# Patient Record
Sex: Female | Born: 1940
Health system: Southern US, Community
[De-identification: ages and names within clinical notes are randomized; demographics above are authoritative.]

## PROBLEM LIST (undated history)

## (undated) DIAGNOSIS — I2699 Other pulmonary embolism without acute cor pulmonale: Secondary | ICD-10-CM

## (undated) DIAGNOSIS — B269 Mumps without complication: Secondary | ICD-10-CM

## (undated) DIAGNOSIS — L237 Allergic contact dermatitis due to plants, except food: Secondary | ICD-10-CM

## (undated) DIAGNOSIS — R29898 Other symptoms and signs involving the musculoskeletal system: Secondary | ICD-10-CM

## (undated) DIAGNOSIS — I10 Essential (primary) hypertension: Secondary | ICD-10-CM

## (undated) DIAGNOSIS — Z8619 Personal history of other infectious and parasitic diseases: Secondary | ICD-10-CM

## (undated) DIAGNOSIS — K219 Gastro-esophageal reflux disease without esophagitis: Secondary | ICD-10-CM

## (undated) DIAGNOSIS — N3281 Overactive bladder: Secondary | ICD-10-CM

## (undated) DIAGNOSIS — D649 Anemia, unspecified: Secondary | ICD-10-CM

## (undated) DIAGNOSIS — R609 Edema, unspecified: Secondary | ICD-10-CM

## (undated) DIAGNOSIS — M419 Scoliosis, unspecified: Secondary | ICD-10-CM

## (undated) HISTORY — DX: Personal history of other infectious and parasitic diseases: Z86.19

## (undated) HISTORY — DX: Other pulmonary embolism without acute cor pulmonale: I26.99

## (undated) HISTORY — DX: Overactive bladder: N32.81

## (undated) HISTORY — DX: Mumps without complication: B26.9

## (undated) HISTORY — DX: Other symptoms and signs involving the musculoskeletal system: R29.898

## (undated) HISTORY — PX: KNEE SURGERY: SHX244

## (undated) HISTORY — PX: FOOT SURGERY: SHX648

## (undated) HISTORY — DX: Edema, unspecified: R60.9

---

## 1997-12-29 ENCOUNTER — Other Ambulatory Visit: Admission: RE | Admit: 1997-12-29 | Discharge: 1997-12-29 | Payer: Self-pay | Admitting: Obstetrics & Gynecology

## 1998-10-05 ENCOUNTER — Other Ambulatory Visit: Admission: RE | Admit: 1998-10-05 | Discharge: 1998-10-05 | Payer: Self-pay | Admitting: Obstetrics & Gynecology

## 1999-05-02 ENCOUNTER — Other Ambulatory Visit: Admission: RE | Admit: 1999-05-02 | Discharge: 1999-05-02 | Payer: Self-pay | Admitting: Obstetrics & Gynecology

## 2000-05-04 ENCOUNTER — Other Ambulatory Visit: Admission: RE | Admit: 2000-05-04 | Discharge: 2000-05-04 | Payer: Self-pay | Admitting: Obstetrics & Gynecology

## 2001-08-31 ENCOUNTER — Other Ambulatory Visit: Admission: RE | Admit: 2001-08-31 | Discharge: 2001-08-31 | Payer: Self-pay | Admitting: Obstetrics & Gynecology

## 2002-09-14 ENCOUNTER — Other Ambulatory Visit: Admission: RE | Admit: 2002-09-14 | Discharge: 2002-09-14 | Payer: Self-pay | Admitting: Obstetrics & Gynecology

## 2003-10-04 ENCOUNTER — Other Ambulatory Visit: Admission: RE | Admit: 2003-10-04 | Discharge: 2003-10-04 | Payer: Self-pay | Admitting: Obstetrics & Gynecology

## 2003-11-23 ENCOUNTER — Ambulatory Visit (HOSPITAL_COMMUNITY): Admission: RE | Admit: 2003-11-23 | Discharge: 2003-11-23 | Payer: Self-pay | Admitting: Gastroenterology

## 2005-03-06 ENCOUNTER — Other Ambulatory Visit: Admission: RE | Admit: 2005-03-06 | Discharge: 2005-03-06 | Payer: Self-pay | Admitting: Obstetrics & Gynecology

## 2007-01-14 ENCOUNTER — Ambulatory Visit (HOSPITAL_COMMUNITY): Admission: RE | Admit: 2007-01-14 | Discharge: 2007-01-14 | Payer: Self-pay | Admitting: Chiropractic Medicine

## 2008-02-09 ENCOUNTER — Encounter: Admission: RE | Admit: 2008-02-09 | Discharge: 2008-03-01 | Payer: Self-pay | Admitting: Internal Medicine

## 2010-02-28 ENCOUNTER — Ambulatory Visit (HOSPITAL_COMMUNITY): Admission: RE | Admit: 2010-02-28 | Discharge: 2010-02-28 | Payer: Self-pay | Admitting: Gastroenterology

## 2010-12-20 NOTE — Op Note (Signed)
NAME:  Dominique Cruz, Dominique Cruz NO.:  [id number]   MEDICAL RECORD NO.:  [id number]                   PATIENT TYPE:  AMB   LOCATION:  ENDO                                 FACILITY:  [facility]   PHYSICIAN:  Danise Edge, M.D.                DATE OF BIRTH:  1941/03/20   DATE OF PROCEDURE:  11/23/2003  DATE OF DISCHARGE:                                 OPERATIVE REPORT   PROCEDURE:  Screening colonoscopy.   REFERRING PHYSICIAN:  Georgann Housekeeper, M.D. with [facility].   INDICATIONS FOR PROCEDURE:  Dominique Cruz is a 70 year old female born  07-09-1941.  Dominique Cruz is scheduled to undergo her first screening  colonoscopy with polypectomy to prevent colon cancer.   ENDOSCOPIST:  Danise Edge, M.D.   PREMEDICATION:  Versed 6 mg, Demerol 60 mg.   PROCEDURE IN DETAIL:  After obtaining informed consent, Dominique Cruz was  placed in the left lateral decubitus position.  I administered intravenous  Demerol and intravenous Versed to achieve conscious sedation for the  procedure.  Patient's blood pressure, oxygen saturation, and cardiac rhythm  were monitored throughout the procedure and documented in the medical  record.   Anal inspection and digital rectal exam was normal.  The Olympus adjustable  pediatric colonoscope was then introduced into the rectum and advanced to  the cecum.  Colonic preparation for the exam today was excellent.   Rectum was normal.  Sigmoid colon and descending colon normal.  Splenic  flexure normal.  Transverse colon normal.  Hepatic flexure normal.  Ascending colon normal.  Cecum and ileocecal valve normal.   ASSESSMENT:  Normal screening proctocolonoscopy of the cecum.  No endoscopic  evidence for the presence of colorectal neoplasia.                                               Danise Edge, M.D.    MJ/MEDQ  D:  11/23/2003  T:  11/23/2003  Job:  [id number]   cc:   Georgann Housekeeper, M.D.  [address]  [address]  [place]  [place] [address]  Fax: [phone]

## 2011-05-16 ENCOUNTER — Emergency Department (INDEPENDENT_AMBULATORY_CARE_PROVIDER_SITE_OTHER): Payer: 59

## 2011-05-16 ENCOUNTER — Encounter: Payer: Self-pay | Admitting: *Deleted

## 2011-05-16 ENCOUNTER — Other Ambulatory Visit: Payer: Self-pay

## 2011-05-16 ENCOUNTER — Emergency Department (HOSPITAL_BASED_OUTPATIENT_CLINIC_OR_DEPARTMENT_OTHER)
Admission: EM | Admit: 2011-05-16 | Discharge: 2011-05-17 | Disposition: A | Discharge status: Home / Self Care | Payer: 59 | Attending: Emergency Medicine | Admitting: Emergency Medicine

## 2011-05-16 DIAGNOSIS — R0789 Other chest pain: Secondary | ICD-10-CM

## 2011-05-16 DIAGNOSIS — R079 Chest pain, unspecified: Secondary | ICD-10-CM | POA: Insufficient documentation

## 2011-05-16 DIAGNOSIS — M810 Age-related osteoporosis without current pathological fracture: Secondary | ICD-10-CM | POA: Insufficient documentation

## 2011-05-16 DIAGNOSIS — R791 Abnormal coagulation profile: Secondary | ICD-10-CM

## 2011-05-16 DIAGNOSIS — K219 Gastro-esophageal reflux disease without esophagitis: Secondary | ICD-10-CM | POA: Insufficient documentation

## 2011-05-16 DIAGNOSIS — Z79899 Other long term (current) drug therapy: Secondary | ICD-10-CM | POA: Insufficient documentation

## 2011-05-16 DIAGNOSIS — I2699 Other pulmonary embolism without acute cor pulmonale: Secondary | ICD-10-CM

## 2011-05-16 HISTORY — DX: Gastro-esophageal reflux disease without esophagitis: K21.9

## 2011-05-16 LAB — DIFFERENTIAL
Basophils Absolute: 0 10*3/uL (ref 0.0–0.1)
Basophils Relative: 0 % (ref 0–1)
Eosinophils Absolute: 0.2 10*3/uL (ref 0.0–0.7)
Eosinophils Relative: 2 % (ref 0–5)
Lymphocytes Relative: 26 % (ref 12–46)
Lymphs Abs: 2.4 10*3/uL (ref 0.7–4.0)
Monocytes Absolute: 0.9 10*3/uL (ref 0.1–1.0)
Monocytes Relative: 9 % (ref 3–12)
Neutro Abs: 5.7 10*3/uL (ref 1.7–7.7)
Neutrophils Relative %: 62 % (ref 43–77)

## 2011-05-16 LAB — BASIC METABOLIC PANEL
BUN: 14 mg/dL (ref 6–23)
CO2: 25 mEq/L (ref 19–32)
Calcium: 9.2 mg/dL (ref 8.4–10.5)
Chloride: 99 mEq/L (ref 96–112)
Creatinine, Ser: 0.6 mg/dL (ref 0.50–1.10)
GFR calc Af Amer: >90 mL/min (ref >90)
GFR calc non Af Amer: >90 mL/min (ref >90)
Glucose, Bld: 95 mg/dL (ref 70–99)
Potassium: 3.8 mEq/L (ref 3.5–5.1)
Sodium: 135 mEq/L (ref 135–145)

## 2011-05-16 LAB — CBC
HCT: 35.9 % — ABNORMAL LOW (ref 36.0–46.0)
Hemoglobin: 11.8 g/dL — ABNORMAL LOW (ref 12.0–15.0)
MCH: 26.8 pg (ref 26.0–34.0)
MCHC: 32.9 g/dL (ref 30.0–36.0)
MCV: 81.6 fL (ref 78.0–100.0)
Platelets: 271 10*3/uL (ref 150–400)
RBC: 4.4 MIL/uL (ref 3.87–5.11)
RDW: 17.3 % — ABNORMAL HIGH (ref 11.5–15.5)
WBC: 9.2 10*3/uL (ref 4.0–10.5)

## 2011-05-16 MED ORDER — IOHEXOL 350 MG/ML SOLN
80.0000 mL | Freq: Once | INTRAVENOUS | Status: AC | PRN
  Administered 2011-05-16: 80 mL via INTRAVENOUS

## 2011-05-16 NOTE — ED Notes (Signed)
Pt presents to ED today with leftsided chest pain that started earlier today and radiates around to back.  Pt was seen at PMD and had chest xray and was told to come to ER for additional testing and follow-up.

## 2011-05-16 NOTE — ED Notes (Signed)
Pt c/o left lower chest pain that began yesterday. Pt was seen by her PMD who did a chest xray and blood work. the patient sts her PMD saw a shadow on her lung and the d-dimer is 0.66. Pt's PMD sent her here for a chest ct. Pt flew to/from [place] 2 weeks ago.

## 2011-05-16 NOTE — ED Notes (Signed)
MD at bedside. 

## 2011-05-16 NOTE — ED Provider Notes (Signed)
History     CSN: [id number] Arrival date & time: 05/16/2011  9:49 PM  Chief Complaint  Patient presents with  . Chest Pain    (Consider location/radiation/quality/duration/timing/severity/associated sxs/prior treatment) HPI This is a 70 year old white female who recently traveled to [place] returning September 30. About 24 hours ago she developed a sharp pleuritic pain in her chest. This is located under the left breast and radiating around to the back. It was moderate in severity yesterday and is almost resolved at this time. It was worse with deep breathing or movement. There is no associated dyspnea. She was seen by her physician today who did a d-dimer and found to be elevated at 0.66. She is here for further evaluation. She denies lower extremity pain or swelling.  Past Medical History  Diagnosis Date  . GERD (gastroesophageal reflux disease)   . Osteoporosis     Past Surgical History  Procedure Date  . Foot surgery     No family history on file.  History  Substance Use Topics  . Smoking status: Never Smoker   . Smokeless tobacco: Not on file  . Alcohol Use: Yes    OB History    Grav Para Term Preterm Abortions TAB SAB Ect Mult Living                  Review of Systems  All other systems reviewed and are negative.    Allergies  Contact lens care products and Sulfa drugs cross reactors  Home Medications   Current Outpatient Rx  Name Route Sig Dispense Refill  . CALCIUM CARBONATE-VITAMIN D 600-400 MG-UNIT PO TABS Oral Take 1 tablet by mouth daily.      Marland Kitchen VITAMIN D 1000 UNITS PO TABS Oral Take 1,000 Units by mouth daily.      Marland Kitchen ESOMEPRAZOLE MAGNESIUM 40 MG PO CPDR Oral Take 40 mg by mouth daily before breakfast.      . OMEGA-3 FATTY ACIDS 1000 MG PO CAPS Oral Take 1 g by mouth daily.      . IBUPROFEN 200 MG PO TABS Oral Take 400 mg by mouth every 6 (six) hours as needed. For pain     . MAGNESIUM OXIDE 400 MG PO TABS Oral Take 400 mg by mouth daily.      Marland Kitchen  ONE-DAILY MULTI VITAMINS PO TABS Oral Take 1 tablet by mouth daily.      . OXYBUTYNIN CHLORIDE ER 5 MG PO TB24 Oral Take 5 mg by mouth daily.      Marland Kitchen POTASSIUM 99 MG PO TABS Oral Take 1 tablet by mouth daily.      Marland Kitchen PYRIDOXINE HCL 100 MG PO TABS Oral Take 100 mg by mouth daily.      Marland Kitchen RALOXIFENE HCL 60 MG PO TABS Oral Take 60 mg by mouth daily.      Marland Kitchen VITAMIN B-12 1000 MCG PO TABS Oral Take 1,000 mcg by mouth daily.      Marland Kitchen VITAMIN E 400 UNITS PO CAPS Oral Take 400 Units by mouth daily.        BP 176/76  Pulse 75  Temp(Src) 97.9 F (36.6 C) (Oral)  Resp 18  Wt 193 lb (87.544 kg)  SpO2 98%  Physical Exam General: Well-developed, well-nourished female in no acute distress; appearance consistent with age of record HENT: normocephalic, atraumatic Eyes: pupils equal round and reactive to light; extraocular muscles intact Neck: supple Heart: regular rate and rhythm Lungs: clear to auscultation bilaterally Chest: non-tender Abdomen: soft;  nontender; nondistended; no masses or hepatosplenomegaly; bowel sounds present Extremities: No deformity; full range of motion; pulses normal; no edema Neurologic: Awake, alert and oriented;motor function intact in all extremities and symmetric; no facial droop Skin: Warm and dry Psychiatric: Normal mood and affect    ED Course  Procedures (including critical care time)    MDM   EKG Interpretation:  Date & Time: 05/16/2011 10:20 PM  Rate: 63  Rhythm: normal sinus rhythm  QRS Axis: normal  Intervals: normal  ST/T Wave abnormalities: normal  Conduction Disutrbances:none  Narrative Interpretation:   Old EKG Reviewed: none available  Nursing notes and vitals signs, including pulse oximetry, reviewed.  Summary of this visit's results, reviewed by myself:  Labs:  Results for orders placed during the hospital encounter of 05/16/11  BASIC METABOLIC PANEL      Component Value Range   Sodium 135  135 - 145 (mEq/L)   Potassium 3.8  3.5 - 5.1  (mEq/L)   Chloride 99  96 - 112 (mEq/L)   CO2 25  19 - 32 (mEq/L)   Glucose, Bld 95  70 - 99 (mg/dL)   BUN 14  6 - 23 (mg/dL)   Creatinine, Ser 1.61  0.50 - 1.10 (mg/dL)   Calcium 9.2  8.4 - 09.6 (mg/dL)   GFR calc non Af Amer >90  >90 (mL/min)   GFR calc Af Amer >90  >90 (mL/min)  CBC      Component Value Range   WBC 9.2  4.0 - 10.5 (K/uL)   RBC 4.40  3.87 - 5.11 (MIL/uL)   Hemoglobin 11.8 (*) 12.0 - 15.0 (g/dL)   HCT 04.5 (*) 40.9 - 46.0 (%)   MCV 81.6  78.0 - 100.0 (fL)   MCH 26.8  26.0 - 34.0 (pg)   MCHC 32.9  30.0 - 36.0 (g/dL)   RDW 81.1 (*) 91.4 - 15.5 (%)   Platelets 271  150 - 400 (K/uL)  DIFFERENTIAL      Component Value Range   Neutrophils Relative 62  43 - 77 (%)   Neutro Abs 5.7  1.7 - 7.7 (K/uL)   Lymphocytes Relative 26  12 - 46 (%)   Lymphs Abs 2.4  0.7 - 4.0 (K/uL)   Monocytes Relative 9  3 - 12 (%)   Monocytes Absolute 0.9  0.1 - 1.0 (K/uL)   Eosinophils Relative 2  0 - 5 (%)   Eosinophils Absolute 0.2  0.0 - 0.7 (K/uL)   Basophils Relative 0  0 - 1 (%)   Basophils Absolute 0.0  0.0 - 0.1 (K/uL)    Imaging Studies: Ct Angio Chest W/cm &/or Wo Cm  05/16/2011  *RADIOLOGY REPORT*  Clinical Data:  Left lower chest pain for 1 day, elevated D-dimer, recent air travel to and from [place]  CT ANGIOGRAPHY CHEST WITH CONTRAST  Technique:  Multidetector CT imaging of the chest was performed using the standard protocol during bolus administration of intravenous contrast.  Multiplanar CT image reconstructions including MIPs were obtained to evaluate the vascular anatomy.  Contrast: 80mL OMNIPAQUE IOHEXOL 350 MG/ML IV SOLN  Comparison:  None  Findings: Aorta normal caliber with minimal scattered atherosclerotic calcification. Visualized portion of upper abdomen unremarkable. No thoracic adenopathy. Single small filling defect identified within left lower lobe pulmonary artery compatible pulmonary embolism. Associated atelectasis in the left lower lobe with minimal associated  pleural effusion. Mild subsegmental atelectasis right middle and right lower lobes. Remaining pulmonary arteries patent. No pulmonary infiltrate, mass or nodule.  Bones unremarkable.  Review of the MIP images confirms the above findings.  IMPRESSION: Single small pulmonary embolus in a left lower lobe pulmonary arterial branch. Associated atelectasis and minimal effusion and left lung base. Mild right basilar atelectasis as well.  Critical Value/emergent results were called by telephone at the time of interpretation on 05/16/2011  at 2350 hrs  to  Dr. Read Drivers, who verbally acknowledged these results.  Original Report Authenticated By: Lollie Marrow, M.D.    12:16 AM The patient's diagnosis and care plan was discussed with Dr. Clent Ridges of the [facility] medicine. We will start the patient on twice daily Lovenox shots at home. She will be educated by nursing staff who will provide her with the Lovenox patient education package. Dr. Clent Ridges we'll arrange for the patient does be seen by Dr. Donette Larry on Monday.          Hanley Seamen, MD 05/17/11 [id number]

## 2011-05-17 MED ORDER — ENOXAPARIN SODIUM 60 MG/0.6ML ~~LOC~~ SOLN
1.0000 mg/kg | Freq: Once | SUBCUTANEOUS | Status: DC
  Filled 2011-05-17: qty 0.3

## 2011-05-17 MED ORDER — ENOXAPARIN SODIUM 150 MG/ML ~~LOC~~ SOLN
SUBCUTANEOUS | Status: AC
  Administered 2011-05-17: 90 mg
  Filled 2011-05-17: qty 1

## 2011-05-17 MED ORDER — ENOXAPARIN SODIUM 100 MG/ML ~~LOC~~ SOLN: 90.0000 mg | Freq: Two times a day (BID) | SUBCUTANEOUS | Status: DC

## 2011-05-17 NOTE — ED Notes (Signed)
Education and take home kit provided to pt and family.  Pt stated understanding and able to verbalize procedure

## 2011-05-19 ENCOUNTER — Other Ambulatory Visit: Payer: Self-pay | Admitting: Internal Medicine

## 2011-05-20 ENCOUNTER — Ambulatory Visit
Admission: RE | Admit: 2011-05-20 | Discharge: 2011-05-20 | Disposition: A | Discharge status: Home / Self Care | Payer: 59 | Source: Ambulatory Visit | Attending: Internal Medicine | Admitting: Internal Medicine

## 2012-12-06 ENCOUNTER — Ambulatory Visit: Payer: Self-pay | Admitting: Podiatry

## 2013-05-11 ENCOUNTER — Encounter: Payer: Self-pay | Admitting: Podiatry

## 2013-05-11 ENCOUNTER — Ambulatory Visit (INDEPENDENT_AMBULATORY_CARE_PROVIDER_SITE_OTHER): Payer: 59 | Admitting: Podiatry

## 2013-05-11 VITALS — BP 142/80 | HR 64 | Resp 16 | Ht 63.0 in | Wt 164.0 lb

## 2013-05-11 DIAGNOSIS — M779 Enthesopathy, unspecified: Secondary | ICD-10-CM

## 2013-05-11 DIAGNOSIS — G5761 Lesion of plantar nerve, right lower limb: Secondary | ICD-10-CM

## 2013-05-11 DIAGNOSIS — G5760 Lesion of plantar nerve, unspecified lower limb: Secondary | ICD-10-CM

## 2013-05-11 NOTE — Progress Notes (Signed)
Subjective:     Patient ID: Dominique Cruz, female   DOB: [birth year], 72 y.o.   MRN: [id number]  HPI patient states my right foot is feeling much better but I need new pads.   Review of Systems  All other systems reviewed and are negative.       Objective:   Physical Exam  Vitals reviewed. Cardiovascular: Intact distal pulses.    neurological intact. Patient's right forefoot is doing much better with a negative Mulder's sign noted. Odder discomfort around the metatarsal heads where there are prominent.    Assessment:     Neuroma symptoms doing well. Mild capsulitis noted due to foot structure.    Plan:     H&P performed. No injection today but metatarsal pads were dispensed to reduce pressure against metatarsal heads. Reappoint as needed

## 2013-06-06 ENCOUNTER — Ambulatory Visit (INDEPENDENT_AMBULATORY_CARE_PROVIDER_SITE_OTHER): Payer: 59 | Admitting: Podiatry

## 2013-06-06 ENCOUNTER — Encounter: Payer: Self-pay | Admitting: Podiatry

## 2013-06-06 VITALS — BP 140/74 | HR 64 | Resp 16

## 2013-06-06 DIAGNOSIS — M779 Enthesopathy, unspecified: Secondary | ICD-10-CM

## 2013-06-06 MED ORDER — TRIAMCINOLONE ACETONIDE 10 MG/ML IJ SUSP
5.0000 mg | Freq: Once | INTRAMUSCULAR | Status: AC
  Administered 2013-06-06: 5 mg via INTRA_ARTICULAR

## 2013-06-07 NOTE — Progress Notes (Signed)
Subjective:     Patient ID: Dominique Cruz, female   DOB: 1941-05-26, 72 y.o.   MRN: [id number]  Toe Pain    patient points to the right big toe stating it has been getting sore. Does not remember specific injury   Review of Systems     Objective:   Physical Exam  Vitals reviewed. Constitutional: She is oriented to person, place, and time.  Cardiovascular: Intact distal pulses.   Musculoskeletal: Normal range of motion.  Neurological: She is oriented to person, place, and time.  Skin: Skin is warm.   patient has pain in the interphalangeal joint of the right hallux with mild swelling noted    Assessment:     Tendinitis right interphalangeal joint hallux    Plan:     Prep done and the interphalangeal injection administered 3 mg Kenalog 5 mg Xylocaine. Reappoint if symptoms occur

## 2013-07-20 ENCOUNTER — Ambulatory Visit: Payer: 59 | Admitting: Podiatry

## 2013-07-27 ENCOUNTER — Encounter: Payer: Self-pay | Admitting: Podiatry

## 2013-07-27 ENCOUNTER — Ambulatory Visit (INDEPENDENT_AMBULATORY_CARE_PROVIDER_SITE_OTHER): Payer: 59 | Admitting: Podiatry

## 2013-07-27 VITALS — BP 152/80 | HR 74 | Resp 16 | Ht 64.0 in | Wt 169.0 lb

## 2013-07-27 DIAGNOSIS — M7750 Other enthesopathy of unspecified foot: Secondary | ICD-10-CM

## 2013-07-27 DIAGNOSIS — M2040 Other hammer toe(s) (acquired), unspecified foot: Secondary | ICD-10-CM

## 2013-07-27 NOTE — Progress Notes (Signed)
Subjective:     Patient ID: Dominique Cruz, female   DOB: 11-03-1940, 72 y.o.   MRN: [id number]  HPI patient presents stating I need new orthotics I have ingrown toenails of both my big toes medial borders and I have pain at the end of my second toe left foot   Review of Systems     Objective:   Physical Exam Neurovascular status is intact with no health history changes noted and I noted there to be incurvated medial border hallux both feet and significant structural changes with tendinitis of both feet. 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 Discussed ingrown toenail correction and she will have that done in January on both feet. Reappoint when orthotics are ready

## 2013-08-01 ENCOUNTER — Ambulatory Visit (INDEPENDENT_AMBULATORY_CARE_PROVIDER_SITE_OTHER): Payer: 59 | Admitting: Podiatry

## 2013-08-01 ENCOUNTER — Encounter: Payer: Self-pay | Admitting: Podiatry

## 2013-08-01 VITALS — BP 156/79 | HR 60 | Resp 12

## 2013-08-01 DIAGNOSIS — L600 Ingrowing nail: Secondary | ICD-10-CM

## 2013-08-01 NOTE — Patient Instructions (Signed)

## 2013-08-01 NOTE — Progress Notes (Signed)
   Subjective:    Patient ID: Dominique Cruz, female    DOB: [birth year], 72 y.o.   MRN: [id number]  HPI Comments: Both big toenails still sore.''  Toe Pain       Review of Systems     Objective:   Physical Exam        Assessment & Plan:

## 2013-08-01 NOTE — Progress Notes (Signed)
Subjective:     Patient ID: Dominique Cruz, female   DOB: 1940-12-20, 72 y.o.   MRN: [id number]  HPI patient presents to have ingrown toenails of both big toes fixed medial border. 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 Reviewed risks patient wants procedure and today I infiltrated each hallux 60 mg Xylocaine Marcaine mixture remove the medial borders exposed matrix and applied phenol 3 applications followed by alcohol lavaged and sterile dressing. Instructed on soaks at this time

## 2014-02-16 ENCOUNTER — Other Ambulatory Visit: Payer: Self-pay | Admitting: Internal Medicine

## 2014-02-16 DIAGNOSIS — Z1331 Encounter for screening for depression: Secondary | ICD-10-CM | POA: Diagnosis not present

## 2014-02-16 DIAGNOSIS — Z1231 Encounter for screening mammogram for malignant neoplasm of breast: Secondary | ICD-10-CM

## 2014-02-16 DIAGNOSIS — Z23 Encounter for immunization: Secondary | ICD-10-CM | POA: Diagnosis not present

## 2014-02-16 DIAGNOSIS — R030 Elevated blood-pressure reading, without diagnosis of hypertension: Secondary | ICD-10-CM | POA: Diagnosis not present

## 2014-02-16 DIAGNOSIS — K219 Gastro-esophageal reflux disease without esophagitis: Secondary | ICD-10-CM | POA: Diagnosis not present

## 2014-02-16 DIAGNOSIS — R7309 Other abnormal glucose: Secondary | ICD-10-CM | POA: Diagnosis not present

## 2014-02-16 DIAGNOSIS — N3942 Incontinence without sensory awareness: Secondary | ICD-10-CM | POA: Diagnosis not present

## 2014-02-16 DIAGNOSIS — Z0000 Encounter for general adult medical examination without abnormal findings: Secondary | ICD-10-CM | POA: Diagnosis not present

## 2014-02-16 DIAGNOSIS — M899 Disorder of bone, unspecified: Secondary | ICD-10-CM | POA: Diagnosis not present

## 2014-02-16 DIAGNOSIS — Z136 Encounter for screening for cardiovascular disorders: Secondary | ICD-10-CM | POA: Diagnosis not present

## 2014-02-16 DIAGNOSIS — M949 Disorder of cartilage, unspecified: Secondary | ICD-10-CM | POA: Diagnosis not present

## 2014-03-16 ENCOUNTER — Ambulatory Visit: Payer: 59

## 2014-03-20 ENCOUNTER — Other Ambulatory Visit: Payer: Self-pay | Admitting: Obstetrics & Gynecology

## 2014-03-20 DIAGNOSIS — Z124 Encounter for screening for malignant neoplasm of cervix: Secondary | ICD-10-CM | POA: Diagnosis not present

## 2014-03-20 DIAGNOSIS — Z1231 Encounter for screening mammogram for malignant neoplasm of breast: Secondary | ICD-10-CM | POA: Diagnosis not present

## 2014-03-21 LAB — CYTOLOGY - PAP

## 2014-05-18 DIAGNOSIS — Z23 Encounter for immunization: Secondary | ICD-10-CM | POA: Diagnosis not present

## 2014-06-22 DIAGNOSIS — Z961 Presence of intraocular lens: Secondary | ICD-10-CM | POA: Diagnosis not present

## 2014-10-05 DIAGNOSIS — M7701 Medial epicondylitis, right elbow: Secondary | ICD-10-CM | POA: Diagnosis not present

## 2014-10-05 DIAGNOSIS — M545 Low back pain: Secondary | ICD-10-CM | POA: Diagnosis not present

## 2014-10-05 DIAGNOSIS — M9905 Segmental and somatic dysfunction of pelvic region: Secondary | ICD-10-CM | POA: Diagnosis not present

## 2014-10-05 DIAGNOSIS — M7702 Medial epicondylitis, left elbow: Secondary | ICD-10-CM | POA: Diagnosis not present

## 2014-10-09 DIAGNOSIS — M7701 Medial epicondylitis, right elbow: Secondary | ICD-10-CM | POA: Diagnosis not present

## 2014-10-09 DIAGNOSIS — M7702 Medial epicondylitis, left elbow: Secondary | ICD-10-CM | POA: Diagnosis not present

## 2014-10-09 DIAGNOSIS — M9905 Segmental and somatic dysfunction of pelvic region: Secondary | ICD-10-CM | POA: Diagnosis not present

## 2014-10-09 DIAGNOSIS — M545 Low back pain: Secondary | ICD-10-CM | POA: Diagnosis not present

## 2014-10-11 DIAGNOSIS — M7701 Medial epicondylitis, right elbow: Secondary | ICD-10-CM | POA: Diagnosis not present

## 2014-10-11 DIAGNOSIS — M9905 Segmental and somatic dysfunction of pelvic region: Secondary | ICD-10-CM | POA: Diagnosis not present

## 2014-10-11 DIAGNOSIS — M545 Low back pain: Secondary | ICD-10-CM | POA: Diagnosis not present

## 2014-10-11 DIAGNOSIS — M7702 Medial epicondylitis, left elbow: Secondary | ICD-10-CM | POA: Diagnosis not present

## 2014-10-16 DIAGNOSIS — M7702 Medial epicondylitis, left elbow: Secondary | ICD-10-CM | POA: Diagnosis not present

## 2014-10-16 DIAGNOSIS — M9905 Segmental and somatic dysfunction of pelvic region: Secondary | ICD-10-CM | POA: Diagnosis not present

## 2014-10-16 DIAGNOSIS — M7701 Medial epicondylitis, right elbow: Secondary | ICD-10-CM | POA: Diagnosis not present

## 2014-10-16 DIAGNOSIS — M545 Low back pain: Secondary | ICD-10-CM | POA: Diagnosis not present

## 2014-10-18 DIAGNOSIS — M9905 Segmental and somatic dysfunction of pelvic region: Secondary | ICD-10-CM | POA: Diagnosis not present

## 2014-10-18 DIAGNOSIS — M545 Low back pain: Secondary | ICD-10-CM | POA: Diagnosis not present

## 2014-10-18 DIAGNOSIS — M7702 Medial epicondylitis, left elbow: Secondary | ICD-10-CM | POA: Diagnosis not present

## 2014-10-18 DIAGNOSIS — M7701 Medial epicondylitis, right elbow: Secondary | ICD-10-CM | POA: Diagnosis not present

## 2014-10-23 DIAGNOSIS — M7702 Medial epicondylitis, left elbow: Secondary | ICD-10-CM | POA: Diagnosis not present

## 2014-10-23 DIAGNOSIS — M7701 Medial epicondylitis, right elbow: Secondary | ICD-10-CM | POA: Diagnosis not present

## 2014-10-23 DIAGNOSIS — M9905 Segmental and somatic dysfunction of pelvic region: Secondary | ICD-10-CM | POA: Diagnosis not present

## 2014-10-23 DIAGNOSIS — M545 Low back pain: Secondary | ICD-10-CM | POA: Diagnosis not present

## 2014-10-25 DIAGNOSIS — M545 Low back pain: Secondary | ICD-10-CM | POA: Diagnosis not present

## 2014-10-25 DIAGNOSIS — M7702 Medial epicondylitis, left elbow: Secondary | ICD-10-CM | POA: Diagnosis not present

## 2014-10-25 DIAGNOSIS — M9905 Segmental and somatic dysfunction of pelvic region: Secondary | ICD-10-CM | POA: Diagnosis not present

## 2014-10-25 DIAGNOSIS — M7701 Medial epicondylitis, right elbow: Secondary | ICD-10-CM | POA: Diagnosis not present

## 2014-12-20 ENCOUNTER — Ambulatory Visit (INDEPENDENT_AMBULATORY_CARE_PROVIDER_SITE_OTHER): Payer: Medicare Other | Admitting: Family Medicine

## 2014-12-20 ENCOUNTER — Encounter: Payer: Self-pay | Admitting: Family Medicine

## 2014-12-20 ENCOUNTER — Encounter (INDEPENDENT_AMBULATORY_CARE_PROVIDER_SITE_OTHER): Payer: Self-pay

## 2014-12-20 VITALS — BP 165/83 | HR 60 | Ht 63.0 in | Wt 165.0 lb

## 2014-12-20 DIAGNOSIS — M25552 Pain in left hip: Secondary | ICD-10-CM

## 2014-12-20 NOTE — Patient Instructions (Signed)
You have piriformis syndrome. Do home exercises (side hip raises, standing hip rotations) 3 sets of 10 once a day. Add ankle weight if these become too easy. Hold stretches for 20-30 seconds, repeat 3 times (pick 2 or 3 on the handout to do every day). Heat 15 minutes at a time 3-4 times a day. Tennis ball massage may help. Call me if you want to do physical therapy. Follow up with me in 6 weeks.

## 2014-12-22 DIAGNOSIS — M25552 Pain in left hip: Secondary | ICD-10-CM | POA: Insufficient documentation

## 2014-12-22 NOTE — Assessment & Plan Note (Signed)
negative logroll, no anterior pain and excellent motion - doubt intraarticular pathology.  Consistent with piriformis syndrome.  Shown home exercises to do daily.  Heat for spasms, tennis ball massage.  Consider PT if not improving.  F/u in 6 weeks.

## 2014-12-22 NOTE — Progress Notes (Signed)
PCP: Georgann HousekeeperHUSAIN,KARRAR, MD  Subjective:   HPI: Patient is a 74 y.o. female here for left hip pain.  Patient reports for about 4 months she's had posterior left hip pain. No known injury or trauma. Leg feels weak and has been since she was young. Tried tylenol when this throbs. Hasn't tried any exercises, stretches.  Past Medical History  Diagnosis Date  . GERD (gastroesophageal reflux disease)   . Osteoporosis   . Swelling     FEET AND LEGS B/L    Current Outpatient Prescriptions on File Prior to Visit  Medication Sig Dispense Refill  . aspirin 81 MG tablet Take 81 mg by mouth daily.    . Calcium Carbonate-Vitamin D (CALTRATE 600+D) 600-400 MG-UNIT per tablet Take 1 tablet by mouth daily.      . cholecalciferol (VITAMIN D) 1000 UNITS tablet Take 1,000 Units by mouth daily.      . fish oil-omega-3 fatty acids 1000 MG capsule Take 1 g by mouth daily.      . fluticasone (FLONASE) 50 MCG/ACT nasal spray     . ibuprofen (ADVIL,MOTRIN) 200 MG tablet Take 400 mg by mouth every 6 (six) hours as needed. For pain     . magnesium oxide (MAG-OX) 400 MG tablet Take 400 mg by mouth daily.      . Multiple Vitamin (MULTIVITAMIN) tablet Take 1 tablet by mouth daily.      Marland Kitchen. oxybutynin (DITROPAN-XL) 5 MG 24 hr tablet Take 5 mg by mouth daily.      . pantoprazole (PROTONIX) 40 MG tablet     . Potassium 99 MG TABS Take 1 tablet by mouth daily.      Marland Kitchen. pyridoxine (B-6) 100 MG tablet Take 100 mg by mouth daily.      Marland Kitchen. triamcinolone ointment (KENALOG) 0.1 %     . vitamin B-12 (CYANOCOBALAMIN) 1000 MCG tablet Take 1,000 mcg by mouth daily.      . vitamin E 400 UNIT capsule Take 400 Units by mouth daily.       No current facility-administered medications on file prior to visit.    Past Surgical History  Procedure Laterality Date  . Foot surgery Bilateral     BUNION  . Foot surgery Right     TENDON  . Knee surgery Right     Allergies  Allergen Reactions  . Ao-Disc Catalyst     Eye swelled shut   . Sulfa Drugs Cross Reactors     unknown    History   Social History  . Marital Status: Married    Spouse Name: N/A  . Number of Children: N/A  . Years of Education: N/A   Occupational History  . Not on file.   Social History Main Topics  . Smoking status: Former Smoker    Types: Cigarettes    Quit date: 05/11/1961  . Smokeless tobacco: Never Used  . Alcohol Use: 0.0 oz/week    0 Standard drinks or equivalent per week     Comment: WINE 3 TIMES WEEKLY  . Drug Use: No  . Sexual Activity: Not on file   Other Topics Concern  . Not on file   Social History Narrative    No family history on file.  BP 165/83 mmHg  Pulse 60  Ht 5\' 3"  (1.6 m)  Wt 165 lb (74.844 kg)  BMI 29.24 kg/m2  Review of Systems: See HPI above.    Objective:  Physical Exam:  Gen: NAD  Left hip: No gross  deformity, swelling, bruising. TTP over piriformis.  No trochanter, back, other tenderness.  No midline or bony TTP. FROM hip without pain passively. Strength LEs 5/5 all muscle groups except hip abduction 4-/5. 2+ MSRs in patellar and achilles tendons, equal bilaterally. Negative SLRs. Sensation intact to light touch bilaterally. Negative logroll bilateral hips Negative fabers.  Feels stretch in area of pain on left with piriformis.    Assessment & Plan:  1. Left hip pain - negative logroll, no anterior pain and excellent motion - doubt intraarticular pathology.  Consistent with piriformis syndrome.  Shown home exercises to do daily.  Heat for spasms, tennis ball massage.  Consider PT if not improving.  F/u in 6 weeks.

## 2015-02-07 ENCOUNTER — Ambulatory Visit (INDEPENDENT_AMBULATORY_CARE_PROVIDER_SITE_OTHER): Payer: Medicare Other | Admitting: Family Medicine

## 2015-02-07 ENCOUNTER — Encounter: Payer: Self-pay | Admitting: Family Medicine

## 2015-02-07 ENCOUNTER — Encounter (INDEPENDENT_AMBULATORY_CARE_PROVIDER_SITE_OTHER): Payer: Self-pay

## 2015-02-07 VITALS — BP 154/60 | HR 66 | Ht 63.0 in | Wt 168.0 lb

## 2015-02-07 DIAGNOSIS — M25552 Pain in left hip: Secondary | ICD-10-CM | POA: Diagnosis not present

## 2015-02-07 DIAGNOSIS — R29898 Other symptoms and signs involving the musculoskeletal system: Secondary | ICD-10-CM

## 2015-02-08 NOTE — Assessment & Plan Note (Signed)
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  She does have some weakness bilaterally on testing - would benefit from physical therapy and a dedicated home exercise program - will go ahead with this.  F/u in 6 weeks.

## 2015-02-08 NOTE — Progress Notes (Signed)
PCP: Georgann Housekeeper, MD  Subjective:   HPI: Patient is a 74 y.o. female here for left hip pain.  5/18: Patient reports for about 4 months she's had posterior left hip pain. No known injury or trauma. Leg feels weak and has been since she was young. Tried tylenol when this throbs. Hasn't tried any exercises, stretches.  7/6: Patient reports she's about 60% improved from last visit. Left leg still feels weak especially with stairs. Doing home exercises. No new injuries or complaints.  Past Medical History  Diagnosis Date  . GERD (gastroesophageal reflux disease)   . Osteoporosis   . Swelling     FEET AND LEGS B/L    Current Outpatient Prescriptions on File Prior to Visit  Medication Sig Dispense Refill  . aspirin 81 MG tablet Take 81 mg by mouth daily.    . Calcium Carbonate-Vitamin D (CALTRATE 600+D) 600-400 MG-UNIT per tablet Take 1 tablet by mouth daily.      . cholecalciferol (VITAMIN D) 1000 UNITS tablet Take 1,000 Units by mouth daily.      . fish oil-omega-3 fatty acids 1000 MG capsule Take 1 g by mouth daily.      . fluticasone (FLONASE) 50 MCG/ACT nasal spray     . ibuprofen (ADVIL,MOTRIN) 200 MG tablet Take 400 mg by mouth every 6 (six) hours as needed. For pain     . magnesium oxide (MAG-OX) 400 MG tablet Take 400 mg by mouth daily.      . Multiple Vitamin (MULTIVITAMIN) tablet Take 1 tablet by mouth daily.      Marland Kitchen oxybutynin (DITROPAN-XL) 5 MG 24 hr tablet Take 5 mg by mouth daily.      . pantoprazole (PROTONIX) 40 MG tablet     . Potassium 99 MG TABS Take 1 tablet by mouth daily.      Marland Kitchen pyridoxine (B-6) 100 MG tablet Take 100 mg by mouth daily.      Marland Kitchen triamcinolone ointment (KENALOG) 0.1 %     . vitamin B-12 (CYANOCOBALAMIN) 1000 MCG tablet Take 1,000 mcg by mouth daily.      . vitamin E 400 UNIT capsule Take 400 Units by mouth daily.       No current facility-administered medications on file prior to visit.    Past Surgical History  Procedure Laterality  Date  . Foot surgery Bilateral     BUNION  . Foot surgery Right     TENDON  . Knee surgery Right     Allergies  Allergen Reactions  . Ao-Disc Catalyst     Eye swelled shut  . Sulfa Drugs Cross Reactors     unknown    History   Social History  . Marital Status: Married    Spouse Name: N/A  . Number of Children: N/A  . Years of Education: N/A   Occupational History  . Not on file.   Social History Main Topics  . Smoking status: Former Smoker    Types: Cigarettes    Quit date: 05/11/1961  . Smokeless tobacco: Never Used  . Alcohol Use: 0.0 oz/week    0 Standard drinks or equivalent per week     Comment: WINE 3 TIMES WEEKLY  . Drug Use: No  . Sexual Activity: Not on file   Other Topics Concern  . Not on file   Social History Narrative    No family history on file.  BP 154/60 mmHg  Pulse 66  Ht  (1.6 m)  Wt 168 lb (  76.204 kg)  BMI 29.77 kg/m2  Review of Systems: See HPI above.    Objective:  Physical Exam:  Gen: NAD  Left hip: No gross deformity, swelling, bruising. TTP over piriformis.  No trochanter, back, other tenderness.  No midline or bony TTP. FROM hip without pain passively. Strength bilateral lower extremities 4/5 with hip flexion, abduction, straight leg raise, adduction.  Otherwise 5/5 bilateral lower extremities. Negative SLRs. Sensation intact to light touch bilaterally. Negative logroll bilateral hips Negative fabers.  Feels stretch in area of pain on left with piriformis.    Assessment & Plan:  1. 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  She does have some weakness bilaterally on testing - would benefit from physical therapy and a dedicated home exercise program - will go ahead with this.  F/u in 6 weeks.

## 2015-02-15 ENCOUNTER — Ambulatory Visit: Payer: Medicare Other | Attending: Family Medicine | Admitting: Physical Therapy

## 2015-02-15 DIAGNOSIS — M25552 Pain in left hip: Secondary | ICD-10-CM | POA: Insufficient documentation

## 2015-02-15 DIAGNOSIS — M6289 Other specified disorders of muscle: Secondary | ICD-10-CM | POA: Insufficient documentation

## 2015-02-15 DIAGNOSIS — R269 Unspecified abnormalities of gait and mobility: Secondary | ICD-10-CM | POA: Insufficient documentation

## 2015-02-15 DIAGNOSIS — M545 Low back pain: Secondary | ICD-10-CM | POA: Insufficient documentation

## 2015-02-21 ENCOUNTER — Ambulatory Visit: Payer: Medicare Other | Admitting: Physical Therapy

## 2015-02-21 DIAGNOSIS — M6289 Other specified disorders of muscle: Secondary | ICD-10-CM | POA: Diagnosis not present

## 2015-02-21 DIAGNOSIS — M25552 Pain in left hip: Secondary | ICD-10-CM | POA: Diagnosis not present

## 2015-02-21 DIAGNOSIS — M545 Low back pain, unspecified: Secondary | ICD-10-CM

## 2015-02-21 DIAGNOSIS — R269 Unspecified abnormalities of gait and mobility: Secondary | ICD-10-CM

## 2015-02-21 DIAGNOSIS — R29898 Other symptoms and signs involving the musculoskeletal system: Secondary | ICD-10-CM

## 2015-02-22 NOTE — Therapy (Signed)
[facility] Outpatient Rehabilitation [facility] [address]  [address] [place], [place], [address] Phone: [phone]   Fax:  [phone]  Physical Therapy Evaluation  Patient Details  Name: Dominique Cruz MRN: [id number] Date of Birth: 1940-11-18 Referring Provider:  Lenda Kelp, MD  Encounter Date: 02/21/2015      PT End of Session - 02/21/15 1328    Visit Number 1   Number of Visits 16   Date for PT Re-Evaluation 04/18/15   PT Start Time 1004   PT Stop Time 1056   PT Time Calculation (min) 52 min   Activity Tolerance Patient tolerated treatment well   Behavior During Therapy [facility] for tasks assessed/performed      Past Medical History  Diagnosis Date  . GERD (gastroesophageal reflux disease)   . Osteoporosis   . Swelling     FEET AND LEGS B/L    Past Surgical History  Procedure Laterality Date  . Foot surgery Bilateral     BUNION  . Foot surgery Right     TENDON  . Knee surgery Right     There were no vitals filed for this visit.  Visit Diagnosis:  Weakness of both hips  Left-sided low back pain without sciatica  Left hip pain  Abnormality of gait      Subjective Assessment - 02/21/15 1010    Subjective Patient presents with complaints of low and bilateral hip pain. Patient gesturing to SI joint/deep buttock as primary location of pain, but reports pain sometimes "wraps down the side of her hips". Patient acknowledges longstanding weakness in legs (left > right since childhood) and wants to work on strengthening legs.   Pertinent History h/o lumbar stenosis   Limitations Sitting;Other (comment)  sleeping   How long can you sit comfortably? 1 hour   How long can you walk comfortably? 1 mile   Patient Stated Goals Walk up/down stairs step over step   Currently in Pain? No/denies   Pain Score --  Least 0/10, Avg 3/10, Worst 5/10   Pain Location Back  SI joint   Pain Orientation Left   Pain Descriptors / Indicators Dull;Aching    Pain Radiating Towards from low back/SI joints to lateral hips L>R   Pain Onset Other (comment)  several years   Pain Frequency Intermittent   Aggravating Factors  working in the garden   Pain Relieving Factors standing, tylenol   Effect of Pain on Daily Activities Limits sitting tolerance, diffcultly finding comfortable sleeping postion, unable to climb stairs reciprocally            [facility] PT Assessment - 02/21/15 1009    Assessment   Medical Diagnosis Left hip pain/weakness   Onset Date/Surgical Date 08/23/14   Next MD Visit 5 weeks   Prior Therapy HEP from MD   Balance Screen   Has the patient fallen in the past 6 months No   Has the patient had a decrease in activity level because of a fear of falling?  No   Is the patient reluctant to leave their home because of a fear of falling?  No   Prior Function   Level of Independence Independent   Vocation Retired   Leisure walking, reading, Media planner   Overall Cognitive Status Within Functional Limits for tasks assessed   Observation/Other Assessments   Focus on Therapeutic Outcomes (FOTO)  38% (62% limited); predicted 50% (50% limited)   ROM / Strength   AROM / PROM /  Strength AROM;Strength   AROM   Overall AROM  Within functional limits for tasks performed   Strength   Strength Assessment Site Hip;Knee   Right/Left Hip Right;Left   Right Hip Flexion 3+/5   Right Hip Extension 3/5   Right Hip ABduction 4-/5   Right Hip ADduction 3+/5   Left Hip Flexion 3+/5   Left Hip Extension 3/5   Left Hip ABduction 3+/5   Left Hip ADduction 3+/5   Flexibility   Soft Tissue Assessment /Muscle Length yes   ITB tight Left   Piriformis tight L>R   Special Tests    Special Tests Hip Special Tests   Hip Special Tests  Luisa Hart (FABER) Test;Trendelenberg Test;Thomas Test;Ober's Test;Piriformis Test   Luisa Hart [facility]) Test   Findings Positive   Side Left;Right  L > R   Trendelenburg Test   Findings Positive   Side Left    Thomas Test    Findings Positive   Side Left;Right   Ober's Test   Findings Positive   Side Left   Piriformis Test   Findings Positive   Side  Left   Ambulation/Gait   Ambulation/Gait Assistance 7: Independent   Gait Pattern Wide base of support  LE ER bilaterally   Stairs Yes   Stair Management Technique Step to pattern;One rail Right        Today's Treatment  Review of MD prescribed exercises -  Piriformis stretches (hooklying FABER position knee to chest and modified piriformis stretch in supine with crossbody pull) ITB stretch in standing (wallflower stretch) - patient reports discomfort at wrist when using arm fully extended to prop against wall, therefore modified stretch using forearm prop to alleviate strain on wrist  HEP instruction - Hamstring stretch with towel 20"x3 SKTC 20"x3 Bridging x10 Standing SLR, hip abduction, hip extension with UE support on chair x10 each            PT Education - 02/21/15 1331    Education provided Yes   Education Details Review of MD issued exercises, Initial PT HEP, Education in use of pillow between knee when sleeping in sidelying to maintain more neutral hip position   Person(s) Educated Patient   Methods Explanation;Demonstration;Handout   Comprehension Verbalized understanding;Returned demonstration;Need further instruction          PT Short Term Goals - 02/22/15 0913    PT SHORT TERM GOAL #1   Title Independent with initial HEP (03/21/15)   Time 4   Period Weeks   Status New           PT Long Term Goals - 02/22/15 0913    PT LONG TERM GOAL #1   Title Independent with advanced HEP (04/18/15)   Time 8   Period Weeks   Status New   PT LONG TERM GOAL #2   Title Proximal bilateral LE strength 4/5 or greater for improved balance and stability during gait (04/18/15)   Time 8   Period Weeks   Status New   PT LONG TERM GOAL #3   Title Patient will climb stairs reciprocally with or without rail support  (04/18/15)   Time 8   Period Weeks   Status New   PT LONG TERM GOAL #4   Title Patient without sleep disturbance related to low back or hip pain (04/18/15)   Time 8   Period Weeks   Status New               Plan - 02/22/15 9604  Clinical Impression Statement Patient presents to OPPT with left low back/hip pain with occasional radiation dowm lateral hip. Flexibility mildly limited with significant weakness noted in core and proximal LE musculature. Pain and weakness resulting in sleep disruption and difficulty with reciprocal stair negotiation.   Pt will benefit from skilled therapeutic intervention in order to improve on the following deficits Pain;Impaired flexibility;Decreased range of motion;Decreased strength;Decreased mobility;Difficulty walking;Abnormal gait;Improper body mechanics;Decreased activity tolerance   Rehab Potential Good   PT Frequency 2x / week   PT Duration 8 weeks   PT Treatment/Interventions Therapeutic exercise;Therapeutic activities;Manual techniques;Functional mobility training;Gait training;Stair training;Balance training;Moist Heat;Ultrasound;Cryotherapy;Electrical Stimulation;Iontophoresis 4mg /ml Dexamethasone;ADLs/Self Care Home Management;Patient/family education   PT Next Visit Plan Review of HEP, low back/hip strengthening   Consulted and Agree with Plan of Care Patient          G-Codes - 03-01-2015 1610    Functional Assessment Tool Used FOTO 38% (62% limited)   Functional Limitation Mobility: Walking and moving around   Mobility: Walking and Moving Around Current Status [phone]) At least 60 percent but less than 80 percent impaired, limited or restricted   Mobility: Walking and Moving Around Goal Status [phone]) At least 40 percent but less than 60 percent impaired, limited or restricted  predicted 50%       Problem List Patient Active Problem List   Diagnosis Date Noted  . Left hip pain 12/22/2014    [name], PT, MPT 03-01-15,  9:39 AM  [facility] [address]  [address] [place], [place], [address] Phone: [phone]   Fax:  [phone]

## 2015-02-23 ENCOUNTER — Ambulatory Visit: Payer: Medicare Other | Admitting: Rehabilitation

## 2015-02-23 DIAGNOSIS — M545 Low back pain, unspecified: Secondary | ICD-10-CM

## 2015-02-23 DIAGNOSIS — R269 Unspecified abnormalities of gait and mobility: Secondary | ICD-10-CM | POA: Diagnosis not present

## 2015-02-23 DIAGNOSIS — R29898 Other symptoms and signs involving the musculoskeletal system: Secondary | ICD-10-CM

## 2015-02-23 DIAGNOSIS — M6289 Other specified disorders of muscle: Secondary | ICD-10-CM | POA: Diagnosis not present

## 2015-02-23 DIAGNOSIS — M25552 Pain in left hip: Secondary | ICD-10-CM | POA: Diagnosis not present

## 2015-02-23 NOTE — Therapy (Signed)
[facility] Outpatient Rehabilitation [facility] [address]  [address] [place], [place], [address] Phone: [phone]   Fax:  [phone]  Physical Therapy Treatment  Patient Details  Name: Dominique Cruz MRN: [id number] Date of Birth: [birth year] Referring Provider:  Lenda Kelp, MD  Encounter Date: 02/23/2015      PT End of Session - 02/23/15 0848    Visit Number 2   Number of Visits 16   Date for PT Re-Evaluation 04/18/15   PT Start Time 0846   PT Stop Time 0925   PT Time Calculation (min) 39 min      Past Medical History  Diagnosis Date  . GERD (gastroesophageal reflux disease)   . Osteoporosis   . Swelling     FEET AND LEGS B/L    Past Surgical History  Procedure Laterality Date  . Foot surgery Bilateral     BUNION  . Foot surgery Right     TENDON  . Knee surgery Right     There were no vitals filed for this visit.  Visit Diagnosis:  Weakness of both hips  Left-sided low back pain without sciatica  Left hip pain  Abnormality of gait      Subjective Assessment - 02/23/15 0848    Subjective Denies pain currently but states it can reach a 1-2/10 when it hits. Reports compliance with HEP and notes some pain but limits the motion.    Currently in Pain? No/denies     Today's Treatment: TherEx- Nustep level 4x5' (UE/LE) Stretch Bil SKTC, Piriformis, ITB 3x20" (manually) Bridge x10 Hooklying Hip Add with ball 5"x10 Hooklying Hip Abd with Green TB DL O96, SL E95  Hooklying March x10 SLR x10 each Hooklying Hip Ext Iso into plinth 3"x10 Bridges x10      PT Short Term Goals - 02/23/15 0850    PT SHORT TERM GOAL #1   Title Independent with initial HEP (03/21/15)   Status On-going           PT Long Term Goals - 02/23/15 0850    PT LONG TERM GOAL #1   Title Independent with advanced HEP (04/18/15)   Status On-going   PT LONG TERM GOAL #2   Title Proximal bilateral LE strength 4/5 or greater for improved balance and  stability during gait (04/18/15)   Status On-going   PT LONG TERM GOAL #3   Title Patient will climb stairs reciprocally with or without rail support (04/18/15)   Status On-going   PT LONG TERM GOAL #4   Title Patient without sleep disturbance related to low back or hip pain (04/18/15)   Status On-going               Plan - 02/23/15 2841    Clinical Impression Statement Good tolerance to exercises with minimal pain noted. Focused mainly on hip strengthening today and will add in core strengthening.    PT Next Visit Plan low back/hip strengthening   Consulted and Agree with Plan of Care Patient        Problem List Patient Active Problem List   Diagnosis Date Noted  . Left hip pain 12/22/2014    Ronney Lion, [place] 02/23/2015, 9:25 AM  [facility] [address]  [address] [place], [place], [address] Phone: [phone]   Fax:  [phone]

## 2015-02-27 ENCOUNTER — Ambulatory Visit: Payer: Medicare Other | Admitting: Rehabilitation

## 2015-02-27 DIAGNOSIS — R29898 Other symptoms and signs involving the musculoskeletal system: Secondary | ICD-10-CM

## 2015-02-27 DIAGNOSIS — M545 Low back pain, unspecified: Secondary | ICD-10-CM

## 2015-02-27 DIAGNOSIS — M25552 Pain in left hip: Secondary | ICD-10-CM

## 2015-02-27 DIAGNOSIS — M6289 Other specified disorders of muscle: Secondary | ICD-10-CM | POA: Diagnosis not present

## 2015-02-27 DIAGNOSIS — R269 Unspecified abnormalities of gait and mobility: Secondary | ICD-10-CM

## 2015-02-27 NOTE — Therapy (Signed)
[facility] Outpatient Rehabilitation [facility] [address]  [address] [place], [place], [address] Phone: [phone]   Fax:  [phone]  Physical Therapy Treatment  Patient Details  Name: Dominique Cruz MRN: [id number] Date of Birth: [birth year] Referring Provider:  Lenda Kelp, MD  Encounter Date: 02/27/2015      PT End of Session - 02/27/15 0844    Visit Number 3   Number of Visits 16   Date for PT Re-Evaluation 04/18/15   PT Start Time 0845   PT Stop Time 0923   PT Time Calculation (min) 38 min      Past Medical History  Diagnosis Date  . GERD (gastroesophageal reflux disease)   . Osteoporosis   . Swelling     FEET AND LEGS B/L    Past Surgical History  Procedure Laterality Date  . Foot surgery Bilateral     BUNION  . Foot surgery Right     TENDON  . Knee surgery Right     There were no vitals filed for this visit.  Visit Diagnosis:  Weakness of both hips  Left-sided low back pain without sciatica  Left hip pain  Abnormality of gait      Subjective Assessment - 02/27/15 0846    Subjective Felt ok after last time, "I could feel that I had done exercise."   Currently in Pain? No/denies      Today's Treatment:  TherEx- Nustep level 4x5' (UE/LE) Stretch Bil SKTC, Piriformis, ITB 3x20" (manually) Bridge with ball squeeze 5"x10 Hooklying Hip Abd with Green TB DL O96 Side-Lying Clam ER with Green TB x10 bilateral (noted pain on Lt but pt able to complete) Hooklying Hip Ext Iso into plinth 3"x12 Slow March on Blue Foam x10 Heel Raises on Blue Foam x10  Manual- Rt Side-Lying STM and Strumming to Lt ITB        PT Short Term Goals - 02/27/15 0850    PT SHORT TERM GOAL #1   Title Independent with initial HEP (03/21/15)   Status Achieved           PT Long Term Goals - 02/23/15 0850    PT LONG TERM GOAL #1   Title Independent with advanced HEP (04/18/15)   Status On-going   PT LONG TERM GOAL #2   Title Proximal  bilateral LE strength 4/5 or greater for improved balance and stability during gait (04/18/15)   Status On-going   PT LONG TERM GOAL #3   Title Patient will climb stairs reciprocally with or without rail support (04/18/15)   Status On-going   PT LONG TERM GOAL #4   Title Patient without sleep disturbance related to low back or hip pain (04/18/15)   Status On-going               Plan - 02/27/15 2952    Clinical Impression Statement Extremely tight and tender ITB noted with pt only able to handle light pressure. Good tolerance to exercises with pain noted only during Lt Clam ER and marching on blue foam.    PT Next Visit Plan low back/hip strengthening, ITB STM   Consulted and Agree with Plan of Care Patient        Problem List Patient Active Problem List   Diagnosis Date Noted  . Left hip pain 12/22/2014    Ronney Lion, PTA 02/27/2015, 9:24 AM  [facility] Outpatient Rehabilitation [facility] [address]  [address] [place], [place],  [place] Phone: [phone]   Fax:  [phone]

## 2015-03-01 ENCOUNTER — Ambulatory Visit: Payer: Medicare Other | Admitting: Physical Therapy

## 2015-03-01 DIAGNOSIS — R269 Unspecified abnormalities of gait and mobility: Secondary | ICD-10-CM | POA: Diagnosis not present

## 2015-03-01 DIAGNOSIS — M6289 Other specified disorders of muscle: Secondary | ICD-10-CM | POA: Diagnosis not present

## 2015-03-01 DIAGNOSIS — R29898 Other symptoms and signs involving the musculoskeletal system: Secondary | ICD-10-CM

## 2015-03-01 DIAGNOSIS — M545 Low back pain, unspecified: Secondary | ICD-10-CM

## 2015-03-01 DIAGNOSIS — M25552 Pain in left hip: Secondary | ICD-10-CM

## 2015-03-01 NOTE — Therapy (Signed)
[facility] Outpatient Rehabilitation [facility] [address]  [address] [place], [place], [address] Phone: [phone]   Fax:  [phone]  Physical Therapy Treatment  Patient Details  Name: Onelia Cadmus MRN: [id number] Date of Birth: 1941/06/05 Referring Provider:  Lenda Kelp, MD  Encounter Date: 03/01/2015      PT End of Session - 03/01/15 0852    Visit Number 4   Number of Visits 16   Date for PT Re-Evaluation 04/18/15   PT Start Time 0846   PT Stop Time 0928   PT Time Calculation (min) 42 min   Activity Tolerance Patient tolerated treatment well   Behavior During Therapy [facility] for tasks assessed/performed      Past Medical History  Diagnosis Date  . GERD (gastroesophageal reflux disease)   . Osteoporosis   . Swelling     FEET AND LEGS B/L    Past Surgical History  Procedure Laterality Date  . Foot surgery Bilateral     BUNION  . Foot surgery Right     TENDON  . Knee surgery Right     There were no vitals filed for this visit.  Visit Diagnosis:  Weakness of both hips  Left-sided low back pain without sciatica  Left hip pain  Abnormality of gait      Subjective Assessment - 03/01/15 0850    Currently in Pain? Yes   Pain Score 2    Pain Location Back   Pain Orientation Left;Lower           Today's Treatment:  TherEx- Nustep level 4x6' (UE/LE) Stretch Bil SKTC, Piriformis, ITB 3x20" (manually) Bridge with ball squeeze 5"x10 Hooklying Hip Abd with Green TB DL O96 Side-Lying Clam ER with Green TB x10 bilateral (noted pain on Lt and tenderness with TB placement on lateral thighs but pt able to complete) Standing bilateral ITB stretch 3x20" Slow March on Blue Foam x10 - 2 poles Heel Raises on Blue Foam x10 - 2 poles Standing Hip Abduction on Blue Foam x10 - 2 poles Standing Hip Extension on Blue Foam x10 - 2 poles  Manual- Bil Side-Lying STM and Strumming to bilateral ITB            PT Short Term Goals -  03/01/15 1107    PT SHORT TERM GOAL #1   Title Independent with initial HEP (03/21/15)   Status Achieved           PT Long Term Goals - 03/01/15 1106    PT LONG TERM GOAL #1   Title Independent with advanced HEP (04/18/15)   Status On-going   PT LONG TERM GOAL #2   Title Proximal bilateral LE strength 4/5 or greater for improved balance and stability during gait (04/18/15)   Status On-going   PT LONG TERM GOAL #3   Title Patient will climb stairs reciprocally with or without rail support (04/18/15)   Status On-going   PT LONG TERM GOAL #4   Title Patient without sleep disturbance related to low back or hip pain (04/18/15)   Status On-going               Plan - 03/01/15 1044    Clinical Impression Statement Continued tightness/tenderness in bilateral ITB limiting therapy tolerance, with patient tolerating stretching but limited STM and pain limiting resisted hip abduction/ER.   PT Next Visit Plan low back/hip strengthening, ITB STM/stretching   Consulted and Agree with Plan of Care Patient  Problem List Patient Active Problem List   Diagnosis Date Noted  . Left hip pain 12/22/2014    [name], PT, MPT 03/01/2015, 11:09 AM  [facility] [address]  [address] [place], [place], [address] Phone: [phone]   Fax:  [phone]

## 2015-03-02 DIAGNOSIS — R32 Unspecified urinary incontinence: Secondary | ICD-10-CM | POA: Diagnosis not present

## 2015-03-02 DIAGNOSIS — Z0000 Encounter for general adult medical examination without abnormal findings: Secondary | ICD-10-CM | POA: Diagnosis not present

## 2015-03-02 DIAGNOSIS — Z1389 Encounter for screening for other disorder: Secondary | ICD-10-CM | POA: Diagnosis not present

## 2015-03-02 DIAGNOSIS — K219 Gastro-esophageal reflux disease without esophagitis: Secondary | ICD-10-CM | POA: Diagnosis not present

## 2015-03-02 DIAGNOSIS — M1990 Unspecified osteoarthritis, unspecified site: Secondary | ICD-10-CM | POA: Diagnosis not present

## 2015-03-02 DIAGNOSIS — M4806 Spinal stenosis, lumbar region: Secondary | ICD-10-CM | POA: Diagnosis not present

## 2015-03-02 DIAGNOSIS — R7309 Other abnormal glucose: Secondary | ICD-10-CM | POA: Diagnosis not present

## 2015-03-02 DIAGNOSIS — M8588 Other specified disorders of bone density and structure, other site: Secondary | ICD-10-CM | POA: Diagnosis not present

## 2015-03-06 ENCOUNTER — Ambulatory Visit: Payer: Medicare Other | Attending: Family Medicine | Admitting: Rehabilitation

## 2015-03-06 DIAGNOSIS — M6289 Other specified disorders of muscle: Secondary | ICD-10-CM | POA: Diagnosis not present

## 2015-03-06 DIAGNOSIS — M545 Low back pain, unspecified: Secondary | ICD-10-CM

## 2015-03-06 DIAGNOSIS — M25552 Pain in left hip: Secondary | ICD-10-CM | POA: Diagnosis not present

## 2015-03-06 DIAGNOSIS — R269 Unspecified abnormalities of gait and mobility: Secondary | ICD-10-CM | POA: Insufficient documentation

## 2015-03-06 DIAGNOSIS — R29898 Other symptoms and signs involving the musculoskeletal system: Secondary | ICD-10-CM

## 2015-03-06 NOTE — Therapy (Signed)
[facility] Outpatient Rehabilitation [facility] [address]  [address] [place], [place], [address] Phone: [phone]   Fax:  [phone]  Physical Therapy Treatment  Patient Details  Name: Dominique Cruz MRN: [id number] Date of Birth: Oct 22, 1940 Referring Provider:  Lenda Kelp, MD  Encounter Date: 03/06/2015      PT End of Session - 03/06/15 0848    Visit Number 5   Number of Visits 16   Date for PT Re-Evaluation 04/18/15   PT Start Time 0845   PT Stop Time 0925   PT Time Calculation (min) 40 min   Activity Tolerance Patient tolerated treatment well   Behavior During Therapy [facility] for tasks assessed/performed      Past Medical History  Diagnosis Date  . GERD (gastroesophageal reflux disease)   . Osteoporosis   . Swelling     FEET AND LEGS B/L    Past Surgical History  Procedure Laterality Date  . Foot surgery Bilateral     BUNION  . Foot surgery Right     TENDON  . Knee surgery Right     There were no vitals filed for this visit.  Visit Diagnosis:  Weakness of both hips  Left-sided low back pain without sciatica  Left hip pain  Abnormality of gait      Subjective Assessment - 03/06/15 0846    Subjective Reports being really sore today. Was hurting really bad yesterday, could have been from cleaning out their rental home then going to [employer].    Currently in Pain? Yes   Pain Score 4    Pain Location Back   Pain Orientation Left;Lower      TherEx- Nustep level 4x6' (UE/LE) Stretch Bil SKTC, Piriformis, ITB 3x20" (manually) Bridge with ball squeeze 5"x12 Side-Lying Clam ER with Green TB x10 bilateral Standing bilateral ITB stretch 3x20" Slow March on Blue Foam x12 - HHA on counter Standing Hip Abduction on Blue Foam x10 - HHA on counter Standing Hip Extension on Blue Foam x10 - HHA on counter LAQ with Ball Squeeze 10x3"  Manual- Bil Side-Lying STM and Strumming to bilateral ITB       PT Short Term Goals - 03/01/15  1107    PT SHORT TERM GOAL #1   Title Independent with initial HEP (03/21/15)   Status Achieved           PT Long Term Goals - 03/01/15 1106    PT LONG TERM GOAL #1   Title Independent with advanced HEP (04/18/15)   Status On-going   PT LONG TERM GOAL #2   Title Proximal bilateral LE strength 4/5 or greater for improved balance and stability during gait (04/18/15)   Status On-going   PT LONG TERM GOAL #3   Title Patient will climb stairs reciprocally with or without rail support (04/18/15)   Status On-going   PT LONG TERM GOAL #4   Title Patient without sleep disturbance related to low back or hip pain (04/18/15)   Status On-going               Plan - 03/06/15 0912    Clinical Impression Statement Good tolerance to all exercises with no complaint of pain today, just soreness. Still very tight and tender bilateral ITB and pt continues to have limited tolerance to manual work.    PT Next Visit Plan low back/hip strengthening, ITB STM/stretching   Consulted and Agree with Plan of Care Patient  Problem List Patient Active Problem List   Diagnosis Date Noted  . Left hip pain 12/22/2014    Ronney Lion, [place] 03/06/2015, 9:25 AM  [facility] [address]  [address] [place], [place], [address] Phone: [phone]   Fax:  [phone]

## 2015-03-08 ENCOUNTER — Ambulatory Visit: Payer: Medicare Other | Admitting: Physical Therapy

## 2015-03-08 DIAGNOSIS — R29898 Other symptoms and signs involving the musculoskeletal system: Secondary | ICD-10-CM

## 2015-03-08 DIAGNOSIS — M545 Low back pain, unspecified: Secondary | ICD-10-CM

## 2015-03-08 DIAGNOSIS — M25552 Pain in left hip: Secondary | ICD-10-CM | POA: Diagnosis not present

## 2015-03-08 DIAGNOSIS — R269 Unspecified abnormalities of gait and mobility: Secondary | ICD-10-CM

## 2015-03-08 DIAGNOSIS — M6289 Other specified disorders of muscle: Secondary | ICD-10-CM | POA: Diagnosis not present

## 2015-03-08 NOTE — Therapy (Signed)
[facility] Outpatient Rehabilitation [facility] [address]  [address] [place], [place], [address] Phone: [phone]   Fax:  [phone]  Physical Therapy Treatment  Patient Details  Name: Dominique Cruz MRN: [id number] Date of Birth: April 22, 1941 Referring Provider:  Lenda Kelp, MD  Encounter Date: 03/08/2015      PT End of Session - 03/08/15 0946    Visit Number 6   Number of Visits 16   Date for PT Re-Evaluation 04/18/15   PT Start Time 0846   PT Stop Time 0930   PT Time Calculation (min) 44 min   Activity Tolerance Patient tolerated treatment well   Behavior During Therapy [facility] for tasks assessed/performed      Past Medical History  Diagnosis Date  . GERD (gastroesophageal reflux disease)   . Osteoporosis   . Swelling     FEET AND LEGS B/L    Past Surgical History  Procedure Laterality Date  . Foot surgery Bilateral     BUNION  . Foot surgery Right     TENDON  . Knee surgery Right     There were no vitals filed for this visit.  Visit Diagnosis:  Weakness of both hips  Left-sided low back pain without sciatica  Left hip pain  Abnormality of gait      Subjective Assessment - 03/08/15 0855    Subjective Patient repotrs pain only every now and then. States after sitting a while, has difficulty initiating standing and first few steps.   Currently in Pain? No/denies   Pain Score --  Least 0/10, Avg 2-3/10, Worst 7/10         Today's Treatment   TherEx- Nustep level 4x6' (UE/LE) Prone quad stretch with towel 3x20" Prone Hip Extension x10 Side-Lying Clam ER with Green TB x10 bilateral Stretch Bil SKTC, Piriformis, ITB 3x20" (manually) Bridge with ball squeeze 5"x12 Standing bilateral ITB stretch 3x20" LAQ with Ball Squeeze 10x3"   Manual- Bil Side-Lying STM and Strumming to bilateral ITB           PT Short Term Goals - 03/01/15 1107    PT SHORT TERM GOAL #1   Title Independent with initial HEP (03/21/15)   Status Achieved           PT Long Term Goals - 03/08/15 6962    PT LONG TERM GOAL #1   Title Independent with advanced HEP (04/18/15)   Status On-going   PT LONG TERM GOAL #2   Title Proximal bilateral LE strength 4/5 or greater for improved balance and stability during gait (04/18/15)   Status On-going   PT LONG TERM GOAL #3   Title Patient will climb stairs reciprocally with or without rail support (04/18/15)   Status On-going   PT LONG TERM GOAL #4   Title Patient without sleep disturbance related to low back or hip pain (04/18/15)   Status On-going               Plan - 03/08/15 0946    Clinical Impression Statement Patient remains very sensitive/guarded with manual STM but decreasing muscle tension/increased flexibillity noted today. Patient reporting pain less frequent, only "every now and then".   PT Next Visit Plan low back/hip strengthening, ITB STM/stretching   Consulted and Agree with Plan of Care Patient        Problem List Patient Active Problem List   Diagnosis Date Noted  . Left hip pain 12/22/2014    [name], PT, MPT  03/08/2015, 9:58 AM  [facility] [address]  [address] [place], [place], [address] Phone: [phone]   Fax:  [phone]

## 2015-03-13 ENCOUNTER — Ambulatory Visit: Payer: Medicare Other | Admitting: Physical Therapy

## 2015-03-13 DIAGNOSIS — M545 Low back pain, unspecified: Secondary | ICD-10-CM

## 2015-03-13 DIAGNOSIS — M25552 Pain in left hip: Secondary | ICD-10-CM | POA: Diagnosis not present

## 2015-03-13 DIAGNOSIS — R269 Unspecified abnormalities of gait and mobility: Secondary | ICD-10-CM | POA: Diagnosis not present

## 2015-03-13 DIAGNOSIS — M6289 Other specified disorders of muscle: Secondary | ICD-10-CM | POA: Diagnosis not present

## 2015-03-13 DIAGNOSIS — R29898 Other symptoms and signs involving the musculoskeletal system: Secondary | ICD-10-CM

## 2015-03-13 NOTE — Therapy (Signed)
[facility] Outpatient Rehabilitation [facility] [address]  [address] [place], [place], [address] Phone: [phone]   Fax:  [phone]  Physical Therapy Treatment  Patient Details  Name: Dominique Cruz MRN: [id number] Date of Birth: [birth year] Referring Provider:  Lenda Kelp, MD  Encounter Date: 03/13/2015      PT End of Session - 03/13/15 0856    Visit Number 7   Number of Visits 16   Date for PT Re-Evaluation 04/18/15   PT Start Time 0846   PT Stop Time 0931   PT Time Calculation (min) 45 min   Activity Tolerance Patient tolerated treatment well   Behavior During Therapy [facility] for tasks assessed/performed      Past Medical History  Diagnosis Date  . GERD (gastroesophageal reflux disease)   . Osteoporosis   . Swelling     FEET AND LEGS B/L    Past Surgical History  Procedure Laterality Date  . Foot surgery Bilateral     BUNION  . Foot surgery Right     TENDON  . Knee surgery Right     There were no vitals filed for this visit.  Visit Diagnosis:  Weakness of both hips  Left-sided low back pain without sciatica  Left hip pain  Abnormality of gait      Subjective Assessment - 03/13/15 0851    Subjective Patient reports continues to have pain deep in buttock upon initiation of movement after sitting for a period of time.   Currently in Pain? No/denies   Pain Score 0-No pain  Pain upon initial rising to walk up to 4/10   Pain Location Buttocks   Pain Orientation Left            Today's Treatment  TherEx- Nustep level 4x6' (UE/LE) Bridge with ball squeeze 5"x12 Hooklying Hip Abduction with Green TB 12x3" Standing bilateral ITB stretch 3x20" POE 2x1'  Manual- Bil Side-Lying STM and Strumming to bilateral ITB Stretch Bil SKTC, Piriformis, ITB 3x20" (manually) Prone quad stretch 3x20" (manually) Posterior rotation of left innominate bone & Lateral Glide of ASIS to gap anterior SI joint DTM to left  piriformis              PT Short Term Goals - 03/01/15 1107    PT SHORT TERM GOAL #1   Title Independent with initial HEP (03/21/15)   Status Achieved           PT Long Term Goals - 03/08/15 6962    PT LONG TERM GOAL #1   Title Independent with advanced HEP (04/18/15)   Status On-going   PT LONG TERM GOAL #2   Title Proximal bilateral LE strength 4/5 or greater for improved balance and stability during gait (04/18/15)   Status On-going   PT LONG TERM GOAL #3   Title Patient will climb stairs reciprocally with or without rail support (04/18/15)   Status On-going   PT LONG TERM GOAL #4   Title Patient without sleep disturbance related to low back or hip pain (04/18/15)   Status On-going               Plan - 03/13/15 0933    Clinical Impression Statement Patient reporting more focused pain in region of left SI joint today upon arrival to therapy today. Assessment revealed apparent anterior rotation of the innominate bone at the SI joint. Muscle energy techniques performed to correct rotation followed by DTM to piriformis and core stabilzation exercises.Will re-assess at  next visit for change in pain.   PT Next Visit Plan low back/hip strengthening, ITB STM/stretching   Consulted and Agree with Plan of Care Patient        Problem List Patient Active Problem List   Diagnosis Date Noted  . Left hip pain 12/22/2014    [name], PT, MPT 03/13/2015, 12:18 PM  [facility] [address]  [address] [place], [place], [address] Phone: [phone]   Fax:  [phone]

## 2015-03-15 ENCOUNTER — Ambulatory Visit: Payer: Medicare Other | Admitting: Physical Therapy

## 2015-03-15 DIAGNOSIS — R269 Unspecified abnormalities of gait and mobility: Secondary | ICD-10-CM | POA: Diagnosis not present

## 2015-03-15 DIAGNOSIS — M6289 Other specified disorders of muscle: Secondary | ICD-10-CM | POA: Diagnosis not present

## 2015-03-15 DIAGNOSIS — M545 Low back pain, unspecified: Secondary | ICD-10-CM

## 2015-03-15 DIAGNOSIS — R29898 Other symptoms and signs involving the musculoskeletal system: Secondary | ICD-10-CM

## 2015-03-15 DIAGNOSIS — M25552 Pain in left hip: Secondary | ICD-10-CM | POA: Diagnosis not present

## 2015-03-15 NOTE — Therapy (Signed)
[facility] Outpatient Rehabilitation [facility] [address]  [address] [place], [place], [address] Phone: [phone]   Fax:  [phone]  Physical Therapy Treatment  Patient Details  Name: Kawthar Ennen MRN: [id number] Date of Birth: 02/24/1941 Referring Provider:  Lenda Kelp, MD  Encounter Date: 03/15/2015      PT End of Session - 03/15/15 0843    Visit Number 8   Number of Visits 16   Date for PT Re-Evaluation 04/18/15   PT Start Time 0838   PT Stop Time 0928   PT Time Calculation (min) 50 min   Activity Tolerance Patient tolerated treatment well   Behavior During Therapy [facility] for tasks assessed/performed      Past Medical History  Diagnosis Date  . GERD (gastroesophageal reflux disease)   . Osteoporosis   . Swelling     FEET AND LEGS B/L    Past Surgical History  Procedure Laterality Date  . Foot surgery Bilateral     BUNION  . Foot surgery Right     TENDON  . Knee surgery Right     There were no vitals filed for this visit.  Visit Diagnosis:  Weakness of both hips  Left-sided low back pain without sciatica  Left hip pain  Abnormality of gait      Subjective Assessment - 03/15/15 0841    Subjective Patient states pain still typically just upon initiation of movement after period of inactivity, but resolves once moving/walking around. Reports less sleep disturbance and actually able to sleep on left side without pain.   Currently in Pain? Yes   Pain Score 3   just upon initiation of movement   Pain Location Hip   Pain Orientation Left;Posterior   Pain Descriptors / Indicators Sharp   Pain Frequency Intermittent            [facility] PT Assessment - 03/15/15 0920    Strength   Right Hip Flexion 4-/5   Right Hip Extension 3+/5   Right Hip ABduction 4-/5   Right Hip ADduction 4-/5   Left Hip Flexion 3+/5   Left Hip Extension 3+/5   Left Hip ABduction 3+/5  limited by pain   Left Hip ADduction 3+/5        Today's  Treatment  TherEx- Nustep level 5x6' (UE/LE) Bridge with ball squeeze 5"x12 Hooklying Hip Abduction with Green TB 12x3" Clam in Rt/Lt sidelying x10 each SLR 10x3" bilateral  Standing bilateral ITB stretch 3x20" POE 2x1'  Manual- Bil Side-Lying STM and Strumming to bilateral ITB Stretch Bil SKTC, Piriformis, ITB 3x20" (manually) Prone quad stretch 3x20" (manually) Sacral mobs A/P DTM to left piriformis           PT Short Term Goals - 03/01/15 1107    PT SHORT TERM GOAL #1   Title Independent with initial HEP (03/21/15)   Status Achieved           PT Long Term Goals - 03/15/15 0920    PT LONG TERM GOAL #1   Title Independent with advanced HEP (04/18/15)   Status On-going   PT LONG TERM GOAL #2   Title Proximal bilateral LE strength 4/5 or greater for improved balance and stability during gait (04/18/15)   Status On-going   PT LONG TERM GOAL #3   Title Patient will climb stairs reciprocally with or without rail support (04/18/15)   Status On-going   PT LONG TERM GOAL #4   Title Patient without sleep disturbance  related to low back or hip pain (04/18/15)   Status On-going               Plan - 03/15/15 4401    Clinical Impression Statement Pain remains more focused to SI joint today, with hypomobility noted at left SI but no rotation noted on assessment. Sacral mobs initiated to increase mobility at left SIJ with continued DTM and stretching to piriformis to promote iimproved flexibility and reduce pain. Patient reporting less sleep disturbance from hip/low back pain.   PT Next Visit Plan low back/hip strengthening, ITB STM/stretching, sacral mobs, DTM to piriformis PRN   Consulted and Agree with Plan of Care Patient        Problem List Patient Active Problem List   Diagnosis Date Noted  . Left hip pain 12/22/2014    [name], PT, MPT 03/15/2015, 9:33 AM  [facility] [address]  [address] [place], [place], [address] Phone: [phone]   Fax:  [phone]

## 2015-03-20 ENCOUNTER — Ambulatory Visit: Payer: Medicare Other | Admitting: Rehabilitation

## 2015-03-20 DIAGNOSIS — M545 Low back pain, unspecified: Secondary | ICD-10-CM

## 2015-03-20 DIAGNOSIS — M25552 Pain in left hip: Secondary | ICD-10-CM

## 2015-03-20 DIAGNOSIS — R29898 Other symptoms and signs involving the musculoskeletal system: Secondary | ICD-10-CM

## 2015-03-20 DIAGNOSIS — M6289 Other specified disorders of muscle: Secondary | ICD-10-CM | POA: Diagnosis not present

## 2015-03-20 DIAGNOSIS — R269 Unspecified abnormalities of gait and mobility: Secondary | ICD-10-CM | POA: Diagnosis not present

## 2015-03-20 NOTE — Therapy (Signed)
[facility] [address]  [place], [place], [address] Phone: [phone]   Fax:  [phone]  Physical Therapy Treatment  Patient Details  Name: Dominique Cruz MRN: [id number] Date of Birth: [birth year] Referring Provider:  Dene Gentry, MD  Encounter Date: 03/20/2015      PT End of Session - 03/20/15 0846    Visit Number 9   Number of Visits 16   Date for PT Re-Evaluation 04/18/15   PT Start Time 0846   PT Stop Time 0925   PT Time Calculation (min) 39 min      Past Medical History  Diagnosis Date  . GERD (gastroesophageal reflux disease)   . Osteoporosis   . Swelling     FEET AND LEGS B/L    Past Surgical History  Procedure Laterality Date  . Foot surgery Bilateral     BUNION  . Foot surgery Right     TENDON  . Knee surgery Right     There were no vitals filed for this visit.  Visit Diagnosis:  Weakness of both hips  Left-sided low back pain without sciatica  Left hip pain  Abnormality of gait      Subjective Assessment - 03/20/15 0848    Subjective "This is starting to get aggrivating." States she was painfree this morning but then the pain started coming on.    Currently in Pain? Yes   Pain Score --  3-4/10   Pain Location Hip   Pain Orientation Left;Posterior      Today's Treatment  TherEx- Nustep level 5x6' (UE/LE) Bridge with ball squeeze 5"x15 Hooklying Hip Abduction with Green TB 15x3" DL then 10x3" SL Clam in Rt/Lt sidelying x10 each (noted slight pain in LBP) SLR 10x3" bilateral   Standing bilateral ITB stretch 3x20" POE 2x1'  Manual- Bil Side-Lying STM and Strumming to bilateral ITB Stretch Bil SKTC, Piriformis, ITB 3x20" (manually) Prone quad stretch 3x20" (manually)  Assessment of flexibility: + obers on bilateral ITB (very tight and tender)  Tight bilaterally piriformis (Lt>Rt)      PT Short Term Goals - 03/01/15 1107    PT SHORT TERM GOAL #1   Title  Independent with initial HEP (03/21/15)   Status Achieved           PT Long Term Goals - 03/20/15 0914    PT LONG TERM GOAL #1   Title Independent with advanced HEP (04/18/15)   Status On-going   PT LONG TERM GOAL #2   Title Proximal bilateral LE strength 4/5 or greater for improved balance and stability during gait (04/18/15)   Status On-going   PT LONG TERM GOAL #3   Title Patient will climb stairs reciprocally with or without rail support (04/18/15)   Status On-going   PT LONG TERM GOAL #4   Title Patient without sleep disturbance related to low back or hip pain (04/18/15)   Status On-going               Plan - 03/20/15 0913    Clinical Impression Statement Pt seems to be making slight progress with therapy but continues to present with bilateral hip weakness, tight and tender ITB's, and hypomobility with her SI joint (assessed by PT at previous treatment). Pt seems frustrated at lack of progress and has limited tolerance to manual work with her Lt piriformis and bilateral ITB. Pt also still requires assist for lifting Lt LE during transfer on and off equipment and  supine to sit/sit to supine.  No goals met at this point.    PT Next Visit Plan low back/hip strengthening, ITB STM/stretching, sacral mobs, DTM to piriformis PRN   Consulted and Agree with Plan of Care Patient        Problem List Patient Active Problem List   Diagnosis Date Noted  . Left hip pain 12/22/2014    Barbette Hair, [place] 03/20/2015, 9:25 AM  [facility] [address]  [place], [place], [address] Phone: [phone]   Fax:  [phone]

## 2015-03-21 ENCOUNTER — Ambulatory Visit (INDEPENDENT_AMBULATORY_CARE_PROVIDER_SITE_OTHER): Payer: Medicare Other | Admitting: Family Medicine

## 2015-03-21 ENCOUNTER — Encounter: Payer: Self-pay | Admitting: Family Medicine

## 2015-03-21 ENCOUNTER — Encounter (INDEPENDENT_AMBULATORY_CARE_PROVIDER_SITE_OTHER): Payer: Self-pay

## 2015-03-21 VITALS — BP 141/81 | HR 62 | Ht 62.0 in | Wt 170.0 lb

## 2015-03-21 DIAGNOSIS — M25552 Pain in left hip: Secondary | ICD-10-CM

## 2015-03-21 NOTE — Patient Instructions (Signed)
Continue with physical therapy and home exercises. Try to avoid flat shoes, barefoot walking as much as possible. Would consider a trochanteric bursa injection if you continue to struggle over the next 4-6 weeks. Follow up with me at that time.

## 2015-03-22 ENCOUNTER — Ambulatory Visit: Payer: Medicare Other | Admitting: Physical Therapy

## 2015-03-22 DIAGNOSIS — M545 Low back pain, unspecified: Secondary | ICD-10-CM

## 2015-03-22 DIAGNOSIS — M25552 Pain in left hip: Secondary | ICD-10-CM | POA: Diagnosis not present

## 2015-03-22 DIAGNOSIS — R269 Unspecified abnormalities of gait and mobility: Secondary | ICD-10-CM

## 2015-03-22 DIAGNOSIS — M6289 Other specified disorders of muscle: Secondary | ICD-10-CM | POA: Diagnosis not present

## 2015-03-22 DIAGNOSIS — R29898 Other symptoms and signs involving the musculoskeletal system: Secondary | ICD-10-CM

## 2015-03-22 NOTE — Therapy (Signed)
[facility] Outpatient Rehabilitation [facility] [address]  [address] [place], [place], [address] Phone: [phone]   Fax:  [phone]  Physical Therapy Treatment  Patient Details  Name: Dominique Cruz MRN: [id number] Date of Birth: 15-Dec-1940 Referring Provider:  Lenda Kelp, MD  Encounter Date: 03/22/2015      PT End of Session - 03/22/15 0857    Visit Number 10   Number of Visits 16   Date for PT Re-Evaluation 04/18/15   PT Start Time 0846   PT Stop Time 0928   PT Time Calculation (min) 42 min   Activity Tolerance Patient tolerated treatment well   Behavior During Therapy [facility] for tasks assessed/performed      Past Medical History  Diagnosis Date  . GERD (gastroesophageal reflux disease)   . Osteoporosis   . Swelling     FEET AND LEGS B/L    Past Surgical History  Procedure Laterality Date  . Foot surgery Bilateral     BUNION  . Foot surgery Right     TENDON  . Knee surgery Right     There were no vitals filed for this visit.  Visit Diagnosis:  Weakness of both hips  Left-sided low back pain without sciatica  Left hip pain  Abnormality of gait      Subjective Assessment - 03/22/15 0849    Subjective Patient reports sore this morning, maybe due to poor sleep last night. States MD pleased with her progress and told her she could start trying to walk more but to limit it to 1/4 mile.    Currently in Pain? Yes   Pain Score --  Denies pain, just sore   Pain Location Hip   Pain Orientation Right;Left;Posterior   Pain Descriptors / Indicators Sore            [facility] PT Assessment - 03/22/15 0900    Observation/Other Assessments   Focus on Therapeutic Outcomes (FOTO)  45% (55% limitation)            Today's Treatment  TherEx- Nustep level 5x6' (UE/LE) Bridge with ball squeeze 5"x15 Hooklying Hip Abduction with Green TB 15x3" DL then 44W1" SL POE 2x1'  Manual- Bil Side-Lying STM and Strumming to bilateral  ITB Stretch Bil SKTC, KTOS,  Piriformis, ITB 3x20" (manually) Prone quad stretch 3x20" (manually)             PT Short Term Goals - 03/01/15 1107    PT SHORT TERM GOAL #1   Title Independent with initial HEP (03/21/15)   Status Achieved           PT Long Term Goals - 03/20/15 0914    PT LONG TERM GOAL #1   Title Independent with advanced HEP (04/18/15)   Status On-going   PT LONG TERM GOAL #2   Title Proximal bilateral LE strength 4/5 or greater for improved balance and stability during gait (04/18/15)   Status On-going   PT LONG TERM GOAL #3   Title Patient will climb stairs reciprocally with or without rail support (04/18/15)   Status On-going   PT LONG TERM GOAL #4   Title Patient without sleep disturbance related to low back or hip pain (04/18/15)   Status On-going               Plan - 03/22/15 0272    Clinical Impression Statement Patient with increased tightness Rt > Lt today but more tender on Lt ITB, therefore increased time spent  on manual stretching and STM. Will plan to attempt to progress to more standing exercises at next visit.   PT Next Visit Plan low back/hip strengthening, ITB STM/stretching, DTM to piriformis PRN   Consulted and Agree with Plan of Care Patient          G-Codes - Apr 15, 2015 0934    Functional Assessment Tool Used FOTO = 45% (55% limited)   Functional Limitation Mobility: Walking and moving around   Mobility: Walking and Moving Around Current Status [phone]) At least 40 percent but less than 60 percent impaired, limited or restricted   Mobility: Walking and Moving Around Goal Status [phone]) At least 40 percent but less than 60 percent impaired, limited or restricted      Problem List Patient Active Problem List   Diagnosis Date Noted  . Left hip pain 12/22/2014    [name], PT, MPT 2015-04-15, 9:38 AM  [facility] [address]  [address] [place], [place],  [address] Phone: [phone]   Fax:  [phone]

## 2015-03-23 NOTE — Progress Notes (Signed)
PCP: Georgann Housekeeper, MD  Subjective:   HPI: Patient is a 74 y.o. female here for left hip pain.  5/18: Patient reports for about 4 months she's had posterior left hip pain. No known injury or trauma. Leg feels weak and has been since she was young. Tried tylenol when this throbs. Hasn't tried any exercises, stretches.  7/6: Patient reports she's about 60% improved from last visit. Left leg still feels weak especially with stairs. Doing home exercises. No new injuries or complaints.  8/17: Patient reports she's overall doing well. Frustrated that she's still dealing with pain despite PT, doing home exercises. Left leg still weak. Pain up to 4/10 with walking, none at rest. Can radiate from left buttock down left leg.  Past Medical History  Diagnosis Date  . GERD (gastroesophageal reflux disease)   . Osteoporosis   . Swelling     FEET AND LEGS B/L    Current Outpatient Prescriptions on File Prior to Visit  Medication Sig Dispense Refill  . acetaminophen (TYLENOL) 650 MG CR tablet Take 650 mg by mouth every 8 (eight) hours as needed for pain.    Marland Kitchen aspirin 81 MG tablet Take 81 mg by mouth daily.    . Calcium Carbonate-Vitamin D (CALTRATE 600+D) 600-400 MG-UNIT per tablet Take 1 tablet by mouth daily.      . cholecalciferol (VITAMIN D) 1000 UNITS tablet Take 1,000 Units by mouth daily.      . fish oil-omega-3 fatty acids 1000 MG capsule Take 1 g by mouth daily.      . fluticasone (FLONASE) 50 MCG/ACT nasal spray     . ibuprofen (ADVIL,MOTRIN) 200 MG tablet Take 400 mg by mouth every 6 (six) hours as needed. For pain     . magnesium oxide (MAG-OX) 400 MG tablet Take 400 mg by mouth daily.      . Multiple Vitamin (MULTIVITAMIN) tablet Take 1 tablet by mouth daily.      Marland Kitchen oxybutynin (DITROPAN-XL) 5 MG 24 hr tablet Take 5 mg by mouth daily.      . pantoprazole (PROTONIX) 40 MG tablet     . Potassium 99 MG TABS Take 1 tablet by mouth daily.      Marland Kitchen pyridoxine (B-6) 100 MG tablet  Take 100 mg by mouth daily.      Marland Kitchen triamcinolone ointment (KENALOG) 0.1 %     . vitamin B-12 (CYANOCOBALAMIN) 1000 MCG tablet Take 1,000 mcg by mouth daily.      . vitamin E 400 UNIT capsule Take 400 Units by mouth daily.       No current facility-administered medications on file prior to visit.    Past Surgical History  Procedure Laterality Date  . Foot surgery Bilateral     BUNION  . Foot surgery Right     TENDON  . Knee surgery Right     Allergies  Allergen Reactions  . Ao-Disc Catalyst     Eye swelled shut  . Sulfa Drugs Cross Reactors     unknown    Social History   Social History  . Marital Status: Married    Spouse Name: N/A  . Number of Children: N/A  . Years of Education: N/A   Occupational History  . Not on file.   Social History Main Topics  . Smoking status: Former Smoker    Types: Cigarettes    Quit date: 05/11/1961  . Smokeless tobacco: Never Used  . Alcohol Use: 0.0 oz/week    0 Standard drinks  or equivalent per week     Comment: WINE 3 TIMES WEEKLY  . Drug Use: No  . Sexual Activity: Not on file   Other Topics Concern  . Not on file   Social History Narrative    No family history on file.  BP 141/81 mmHg  Pulse 62  Ht  (1.575 m)  Wt 170 lb (77.111 kg)  BMI 31.09 kg/m2  Review of Systems: See HPI above.    Objective:  Physical Exam:  Gen: NAD  Left hip: No gross deformity, swelling, bruising. TTP over piriformis, less greater trochanter.  No back, other tenderness.  No midline or bony TTP. FROM hip without pain passively. Strength bilateral lower extremities 4/5 with hip abduction, straight leg raise.  5/5 other motions bilateral lower extremities. Negative SLRs. Sensation intact to light touch bilaterally. Negative logroll bilateral hips Negative fabers.  Feels stretch in area of pain on left with piriformis.    Assessment & Plan:  1. 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  She doe shave some  weakness still and now with some mild trochanteric tenderness.  Encouraged she continue home exercises and finish out physical therapy.  Can consider bursa injection if she continues to struggle.  F/u in 4-6 weeks.

## 2015-03-23 NOTE — Assessment & Plan Note (Signed)
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  She doe shave some weakness still and now with some mild trochanteric tenderness.  Encouraged she continue home exercises and finish out physical therapy.  Can consider bursa injection if she continues to struggle.  F/u in 4-6 weeks.

## 2015-03-27 ENCOUNTER — Ambulatory Visit: Payer: Medicare Other | Admitting: Rehabilitation

## 2015-03-27 DIAGNOSIS — Z6830 Body mass index (BMI) 30.0-30.9, adult: Secondary | ICD-10-CM | POA: Diagnosis not present

## 2015-03-27 DIAGNOSIS — Z1231 Encounter for screening mammogram for malignant neoplasm of breast: Secondary | ICD-10-CM | POA: Diagnosis not present

## 2015-03-27 DIAGNOSIS — M6289 Other specified disorders of muscle: Secondary | ICD-10-CM | POA: Diagnosis not present

## 2015-03-27 DIAGNOSIS — M25552 Pain in left hip: Secondary | ICD-10-CM

## 2015-03-27 DIAGNOSIS — Z01419 Encounter for gynecological examination (general) (routine) without abnormal findings: Secondary | ICD-10-CM | POA: Diagnosis not present

## 2015-03-27 DIAGNOSIS — M545 Low back pain, unspecified: Secondary | ICD-10-CM

## 2015-03-27 DIAGNOSIS — R29898 Other symptoms and signs involving the musculoskeletal system: Secondary | ICD-10-CM

## 2015-03-27 DIAGNOSIS — R269 Unspecified abnormalities of gait and mobility: Secondary | ICD-10-CM | POA: Diagnosis not present

## 2015-03-27 LAB — HM MAMMOGRAPHY: HM Mammogram: NEGATIVE

## 2015-03-27 NOTE — Therapy (Signed)
[facility] Outpatient Rehabilitation [facility] [address]  [address] [place], [place], [address] Phone: [phone]   Fax:  [phone]  Physical Therapy Treatment  Patient Details  Name: Dominique Cruz MRN: [id number] Date of Birth: 1940-12-27 Referring Provider:  Lenda Kelp, MD  Encounter Date: 03/27/2015      PT End of Session - 03/27/15 0847    Visit Number 11   Number of Visits 16   Date for PT Re-Evaluation 04/18/15   PT Start Time 0844   PT Stop Time 0922   PT Time Calculation (min) 38 min      Past Medical History  Diagnosis Date  . GERD (gastroesophageal reflux disease)   . Osteoporosis   . Swelling     FEET AND LEGS B/L    Past Surgical History  Procedure Laterality Date  . Foot surgery Bilateral     BUNION  . Foot surgery Right     TENDON  . Knee surgery Right     There were no vitals filed for this visit.  Visit Diagnosis:  Weakness of both hips  Left-sided low back pain without sciatica  Left hip pain  Abnormality of gait      Subjective Assessment - 03/27/15 0846    Subjective Reports not doing good today but pain was worse yesterday. Wasn't able to perform exercises yesterday due to soreness and pain. Notes shooting pain coming from her back/ Lt hip area. Unsure why pain was so bad yesterday, thinks she might have over done it but isn't sure.     Currently in Pain? Yes   Pain Score 4   6/10 yesterday   Pain Location Hip   Pain Orientation Right;Left;Posterior   Pain Descriptors / Indicators Sore      Today's Treatment  TherEx- Nustep level 5x6' (UE/LE) Bridge with ball squeeze 5"x15 Hooklying Hip Abduction with Green TB 10x3" SL Hooklying Hip Adduction with Green TB 10x3" SL  Manual- Bil Side-Lying STM and Strumming to bilateral ITB, STM to Lt hamstring and piriformis Stretch Bil SKTC, KTOS, Piriformis, ITB, Hamstring 3x20" (manually) Prone quad stretch 3x20" (manually)       PT Short Term Goals  - 03/01/15 1107    PT SHORT TERM GOAL #1   Title Independent with initial HEP (03/21/15)   Status Achieved           PT Long Term Goals - 03/20/15 0914    PT LONG TERM GOAL #1   Title Independent with advanced HEP (04/18/15)   Status On-going   PT LONG TERM GOAL #2   Title Proximal bilateral LE strength 4/5 or greater for improved balance and stability during gait (04/18/15)   Status On-going   PT LONG TERM GOAL #3   Title Patient will climb stairs reciprocally with or without rail support (04/18/15)   Status On-going   PT LONG TERM GOAL #4   Title Patient without sleep disturbance related to low back or hip pain (04/18/15)   Status On-going               Plan - 03/27/15 0900    Clinical Impression Statement Pt still very tight and tender with bilateral ITB. Unable to begin standing exercises due to more pain today. Continued focus on stretching and manual work with noting tightness/tenderness with Lt lateral hamstring.    PT Next Visit Plan low back/hip strengthening, ITB STM/stretching, DTM to piriformis PRN        Problem List  Patient Active Problem List   Diagnosis Date Noted  . Left hip pain 12/22/2014    Ronney Lion, [place] 03/27/2015, 9:23 AM  [facility] [address]  [address] [place], [place], [address] Phone: [phone]   Fax:  [phone]

## 2015-03-29 ENCOUNTER — Ambulatory Visit: Payer: Medicare Other | Admitting: Physical Therapy

## 2015-03-29 DIAGNOSIS — M545 Low back pain, unspecified: Secondary | ICD-10-CM

## 2015-03-29 DIAGNOSIS — M6289 Other specified disorders of muscle: Secondary | ICD-10-CM | POA: Diagnosis not present

## 2015-03-29 DIAGNOSIS — M25552 Pain in left hip: Secondary | ICD-10-CM

## 2015-03-29 DIAGNOSIS — R269 Unspecified abnormalities of gait and mobility: Secondary | ICD-10-CM

## 2015-03-29 DIAGNOSIS — R29898 Other symptoms and signs involving the musculoskeletal system: Secondary | ICD-10-CM

## 2015-03-29 NOTE — Therapy (Signed)
[facility] Outpatient Rehabilitation [facility] [address]  [address] [place], [place], [address] Phone: [phone]   Fax:  [phone]  Physical Therapy Treatment  Patient Details  Name: Dominique Cruz MRN: [id number] Date of Birth: [birth year] Referring Provider:  Lenda Kelp, MD  Encounter Date: 03/29/2015      PT End of Session - 03/29/15 1418    Visit Number 12   Number of Visits 16   Date for PT Re-Evaluation 04/18/15   PT Start Time 1356   PT Stop Time 1442   PT Time Calculation (min) 46 min   Activity Tolerance Patient tolerated treatment well   Behavior During Therapy [facility] for tasks assessed/performed      Past Medical History  Diagnosis Date  . GERD (gastroesophageal reflux disease)   . Osteoporosis   . Swelling     FEET AND LEGS B/L    Past Surgical History  Procedure Laterality Date  . Foot surgery Bilateral     BUNION  . Foot surgery Right     TENDON  . Knee surgery Right     There were no vitals filed for this visit.  Visit Diagnosis:  Weakness of both hips  Left-sided low back pain without sciatica  Left hip pain  Abnormality of gait      Subjective Assessment - 03/29/15 1402    Subjective Patient reports she as backed off some on the exercises (had been doing all of the exercises prescribed by the MD as well as those in the PT HEP on daily basis even on therapy days), and pain is much improved.   Currently in Pain? Yes   Pain Score 1   Only upon initiation of movement after sitting for a period of time           Today's Treatment  TherEx- Nustep level 5x6' (UE/LE) Bridge with ball squeeze 5"x15 Hooklying Hip Abduction with Green TB 10x3" SL Hooklying Hip Adduction with Green TB 10x3" SL Supine alternating hip extension isometric into peanut ball 10x3" TRX squat 10x3"  Manual- Bil Side-Lying STM and Strumming to bilateral ITB, STM to Lt hamstring and piriformis Stretch Bil SKTC, KTOS, Piriformis,  ITB, Hamstring 3x20" (manually) Prone quad stretch 3x20" (manually)             PT Short Term Goals - 03/01/15 1107    PT SHORT TERM GOAL #1   Title Independent with initial HEP (03/21/15)   Status Achieved           PT Long Term Goals - 03/29/15 1500    PT LONG TERM GOAL #1   Title Independent with advanced HEP (04/18/15)   Status On-going   PT LONG TERM GOAL #2   Title Proximal bilateral LE strength 4/5 or greater for improved balance and stability during gait (04/18/15)   Status On-going   PT LONG TERM GOAL #3   Title Patient will climb stairs reciprocally with or without rail support (04/18/15)   Status On-going   PT LONG TERM GOAL #4   Title Patient without sleep disturbance related to low back or hip pain (04/18/15)   Status On-going               Plan - 03/29/15 1445    Clinical Impression Statement After patient gave detailed report of what she was doing at home for exercises and how often, it appears recent flare-up was related to "overdoing it" at home. Provided education in appropriate frequency for  HEP, specifically skipping HEP on therapy days and taking a day off as needed. Pain much better with patient backing off on HEP since last visit, allowing for progression of exercises during therapy visit.   PT Next Visit Plan low back/hip strengthening, ITB STM/stretching, DTM to piriformis PRN   Consulted and Agree with Plan of Care Patient        Problem List Patient Active Problem List   Diagnosis Date Noted  . Left hip pain 12/22/2014    [name], PT, MPT 03/29/2015, 3:16 PM  [facility] [address]  [address] [place], [place], [address] Phone: [phone]   Fax:  [phone]

## 2015-04-03 ENCOUNTER — Ambulatory Visit: Payer: Medicare Other | Admitting: Physical Therapy

## 2015-04-03 DIAGNOSIS — M545 Low back pain, unspecified: Secondary | ICD-10-CM

## 2015-04-03 DIAGNOSIS — R29898 Other symptoms and signs involving the musculoskeletal system: Secondary | ICD-10-CM

## 2015-04-03 DIAGNOSIS — M25552 Pain in left hip: Secondary | ICD-10-CM | POA: Diagnosis not present

## 2015-04-03 DIAGNOSIS — R269 Unspecified abnormalities of gait and mobility: Secondary | ICD-10-CM

## 2015-04-03 DIAGNOSIS — M6289 Other specified disorders of muscle: Secondary | ICD-10-CM | POA: Diagnosis not present

## 2015-04-03 NOTE — Therapy (Signed)
[facility] Outpatient Rehabilitation [facility] [address]  [address] [place], [place], [address] Phone: [phone]   Fax:  [phone]  Physical Therapy Treatment  Patient Details  Name: Dominique Cruz MRN: [id number] Date of Birth: [birth year] Referring Provider:  Lenda Kelp, MD  Encounter Date: 04/03/2015      PT End of Session - 04/03/15 1409    Visit Number 13   Number of Visits 16   Date for PT Re-Evaluation 04/18/15   PT Start Time 1401   PT Stop Time 1441   PT Time Calculation (min) 40 min   Activity Tolerance Patient tolerated treatment well   Behavior During Therapy [facility] for tasks assessed/performed      Past Medical History  Diagnosis Date  . GERD (gastroesophageal reflux disease)   . Osteoporosis   . Swelling     FEET AND LEGS B/L    Past Surgical History  Procedure Laterality Date  . Foot surgery Bilateral     BUNION  . Foot surgery Right     TENDON  . Knee surgery Right     There were no vitals filed for this visit.  Visit Diagnosis:  Weakness of both hips  Left-sided low back pain without sciatica  Left hip pain  Abnormality of gait      Subjective Assessment - 04/03/15 1404    Subjective Patient reports pain continues to fluctuate, 2/10 today, 3/10 yesterday, with pain one day on weekend preventing her from walking enough to go to the food truck festival in [place].   Currently in Pain? Yes   Pain Score 2   only when walking or climbing stairs   Pain Location Hip   Pain Orientation Left;Posterior   Pain Descriptors / Indicators Aching           Today's Treatment  TherEx- Nustep level 5x6' (UE/LE) TRX squat 15x3" Fwd step-up to 4" step x10, 2 pole A Lat step-up to 4" step x10, 2 pole A Standing Hip Abduction with red TB x10, bilateral, RW for support Standing Hip Extension with red TB x10, bilateral, RW for support   Manual- Stretch Lt SKTC, KTOS, Piriformis, ITB, Hamstring 3x20" (manually) Rt  Side-Lying STM to Lt hamstring and piriformis Prone DTM to Lt piriformis Left sacral mobs - Grade I-II            PT Short Term Goals - 03/01/15 1107    PT SHORT TERM GOAL #1   Title Independent with initial HEP (03/21/15)   Status Achieved           PT Long Term Goals - 03/29/15 1500    PT LONG TERM GOAL #1   Title Independent with advanced HEP (04/18/15)   Status On-going   PT LONG TERM GOAL #2   Title Proximal bilateral LE strength 4/5 or greater for improved balance and stability during gait (04/18/15)   Status On-going   PT LONG TERM GOAL #3   Title Patient will climb stairs reciprocally with or without rail support (04/18/15)   Status On-going   PT LONG TERM GOAL #4   Title Patient without sleep disturbance related to low back or hip pain (04/18/15)   Status On-going               Plan - 04/03/15 1442    Clinical Impression Statement Patient continues to report pain fluctuations but typically isolated to while walking or climbing stairs. Progressed exercises to standing after STM/stretching with patient noting  some increased discomfort in posterior hip/buttocks but able to complete all exercises.   PT Next Visit Plan low back/hip strengthening, ITB STM/stretching, DTM to piriformis PRN   Consulted and Agree with Plan of Care Patient        Problem List Patient Active Problem List   Diagnosis Date Noted  . Left hip pain 12/22/2014    [name], PT, MPT 04/03/2015, 2:56 PM  [facility] [address]  [address] [place], [place], [address] Phone: [phone]   Fax:  [phone]

## 2015-04-05 ENCOUNTER — Ambulatory Visit: Payer: Medicare Other | Attending: Family Medicine | Admitting: Physical Therapy

## 2015-04-05 DIAGNOSIS — M545 Low back pain, unspecified: Secondary | ICD-10-CM

## 2015-04-05 DIAGNOSIS — M6289 Other specified disorders of muscle: Secondary | ICD-10-CM | POA: Insufficient documentation

## 2015-04-05 DIAGNOSIS — M25552 Pain in left hip: Secondary | ICD-10-CM | POA: Insufficient documentation

## 2015-04-05 DIAGNOSIS — R269 Unspecified abnormalities of gait and mobility: Secondary | ICD-10-CM

## 2015-04-05 DIAGNOSIS — M9905 Segmental and somatic dysfunction of pelvic region: Secondary | ICD-10-CM | POA: Diagnosis not present

## 2015-04-05 DIAGNOSIS — R29898 Other symptoms and signs involving the musculoskeletal system: Secondary | ICD-10-CM

## 2015-04-05 NOTE — Therapy (Signed)
[facility] Outpatient Rehabilitation [facility] [address]  [address] [place], [place], [address] Phone: [phone]   Fax:  [phone]  Physical Therapy Treatment  Patient Details  Name: Dominique Cruz MRN: [id number] Date of Birth: 12-Dec-1940 Referring Provider:  Lenda Kelp, MD  Encounter Date: 04/05/2015      PT End of Session - 04/05/15 1414    Visit Number 14   Number of Visits 16   Date for PT Re-Evaluation 04/18/15   PT Start Time 1408   PT Stop Time 1452   PT Time Calculation (min) 44 min   Activity Tolerance Patient tolerated treatment well   Behavior During Therapy [facility] for tasks assessed/performed      Past Medical History  Diagnosis Date  . GERD (gastroesophageal reflux disease)   . Osteoporosis   . Swelling     FEET AND LEGS B/L    Past Surgical History  Procedure Laterality Date  . Foot surgery Bilateral     BUNION  . Foot surgery Right     TENDON  . Knee surgery Right     There were no vitals filed for this visit.  Visit Diagnosis:  Weakness of both hips  Left-sided low back pain without sciatica  Left hip pain  Abnormality of gait      Subjective Assessment - 04/05/15 1412    Subjective Patient reporting no changes in pain.   Currently in Pain? Yes   Pain Score 2             [facility] PT Assessment - 04/05/15 1408    ROM / Strength   AROM / PROM / Strength Strength   Strength   Strength Assessment Site Hip   Right/Left Hip Right;Left   Right Hip Flexion 4/5   Right Hip Extension 4-/5   Right Hip ABduction 4/5   Right Hip ADduction 4-/5   Left Hip Flexion 4-/5   Left Hip Extension 4-/5   Left Hip ABduction 4-/5   Left Hip ADduction 4-/5        Today's Treatment  TherEx- Nustep level 5x5' (UE/LE) TRX squat 15x3" Lt Fwd step-up on BOSU 10x3", HHA on counter Lt Lat step-up on BOSU 10x3", HHA on counter Standing Hip Abduction with red TB x10, bilateral, HHA on counter Standing Hip Extension  with red TB x10, bilateral, HHA on counter Side-stepping with red TB x20 ft, bilateral   Manual- Stretch Lt SKTC, KTOS, Piriformis, ITB, Hamstring 3x20" (manually) Rt Side-Lying STM to Lt hamstring and piriformis Rt Side-Lying DTM to Lt piriformis           PT Short Term Goals - 03/01/15 1107    PT SHORT TERM GOAL #1   Title Independent with initial HEP (03/21/15)   Status Achieved           PT Long Term Goals - 04/05/15 1807    PT LONG TERM GOAL #1   Title Independent with advanced HEP (04/18/15)   Status On-going   PT LONG TERM GOAL #2   Title Proximal bilateral LE strength 4/5 or greater for improved balance and stability during gait (04/18/15)   Status On-going   PT LONG TERM GOAL #3   Title Patient will climb stairs reciprocally with or without rail support (04/18/15)   Status On-going   PT LONG TERM GOAL #4   Title Patient without sleep disturbance related to low back or hip pain (04/18/15)   Status On-going  Plan - 04/05/15 1808    Clinical Impression Statement Overall pain reports improving with pain typically 1-2/10 but persistant tenderness in left piriformis and along left sacral border. LE strength improving with left hip grossly 4-/5 and right hip 4- to 4/5. Patient tolerating progression of increased standing and CKC exercises over past few visits, but still requiring manual therapy for STM and DTM to left piriformis and hamstrings. Approaching the end of the current certification period, therefore will re-evaluate at the end of next week readiness for discharge vs. need for continued therapy.   PT Next Visit Plan low back/hip strengthening, ITB STM/stretching, DTM to piriformis PRN   Consulted and Agree with Plan of Care Patient        Problem List Patient Active Problem List   Diagnosis Date Noted  . Left hip pain 12/22/2014    Dominique Cruz, PT, MPT 04/05/2015, 6:15 PM  [facility] [address]  [address] [place], [place], [address] Phone: [phone]   Fax:  [phone]

## 2015-04-10 ENCOUNTER — Ambulatory Visit: Payer: Medicare Other | Admitting: Physical Therapy

## 2015-04-10 DIAGNOSIS — M545 Low back pain, unspecified: Secondary | ICD-10-CM

## 2015-04-10 DIAGNOSIS — M9905 Segmental and somatic dysfunction of pelvic region: Secondary | ICD-10-CM | POA: Diagnosis not present

## 2015-04-10 DIAGNOSIS — M25552 Pain in left hip: Secondary | ICD-10-CM | POA: Diagnosis not present

## 2015-04-10 DIAGNOSIS — M6289 Other specified disorders of muscle: Secondary | ICD-10-CM | POA: Diagnosis not present

## 2015-04-10 DIAGNOSIS — R269 Unspecified abnormalities of gait and mobility: Secondary | ICD-10-CM

## 2015-04-10 DIAGNOSIS — R29898 Other symptoms and signs involving the musculoskeletal system: Secondary | ICD-10-CM

## 2015-04-10 NOTE — Therapy (Signed)
[facility] Outpatient Rehabilitation [facility] [address]  [address] [place], [place], [address] Phone: [phone]   Fax:  [phone]  Physical Therapy Treatment  Patient Details  Name: Dominique Cruz MRN: [id number] Date of Birth: Dec 21, 1940 Referring Provider:  Lenda Kelp, MD  Encounter Date: 04/10/2015      PT End of Session - 04/10/15 0853    Visit Number 15   Number of Visits 16   Date for PT Re-Evaluation 04/18/15   PT Start Time 0846   PT Stop Time 0928   PT Time Calculation (min) 42 min   Activity Tolerance Patient tolerated treatment well   Behavior During Therapy [facility] for tasks assessed/performed      Past Medical History  Diagnosis Date  . GERD (gastroesophageal reflux disease)   . Osteoporosis   . Swelling     FEET AND LEGS B/L    Past Surgical History  Procedure Laterality Date  . Foot surgery Bilateral     BUNION  . Foot surgery Right     TENDON  . Knee surgery Right     There were no vitals filed for this visit.  Visit Diagnosis:  Weakness of both hips  Left-sided low back pain without sciatica  Left hip pain  Abnormality of gait      Subjective Assessment - 04/10/15 0850    Subjective Patient reports pain was much better over the weekend, although she thinks may have overdone things cleanig up yesterday. States pain higher and more midline than normal for her.   Currently in Pain? Yes   Pain Score 1    Pain Location Back   Pain Orientation Left;Mid;Lower            Today's Treatment  TherEx- Nustep level 5x5' (UE/LE) Standing Hip Abduction with red TB x12, bilateral, HHA on counter Standing Hip Extension with red TB x12, bilateral, HHA on counter Side-stepping with red TB x20 ft, bilateral  TRX squat 15x3" Bil Fwd step-up on BOSU 10x3", HHA on counter Lt Lat step-up on BOSU 10x3", HHA on counter  Manual- Stretch Lt SKTC, KTOS, Piriformis, ITB, Hamstring 3x20" (manually) Rt Side-Lying STM to  Lt hamstring and piriformis Rt Side-Lying DTM to Lt piriformis               PT Short Term Goals - 03/01/15 1107    PT SHORT TERM GOAL #1   Title Independent with initial HEP (03/21/15)   Status Achieved           PT Long Term Goals - 04/05/15 1807    PT LONG TERM GOAL #1   Title Independent with advanced HEP (04/18/15)   Status On-going   PT LONG TERM GOAL #2   Title Proximal bilateral LE strength 4/5 or greater for improved balance and stability during gait (04/18/15)   Status On-going   PT LONG TERM GOAL #3   Title Patient will climb stairs reciprocally with or without rail support (04/18/15)   Status On-going   PT LONG TERM GOAL #4   Title Patient without sleep disturbance related to low back or hip pain (04/18/15)   Status On-going               Plan - 04/10/15 0932    Clinical Impression Statement Reporting pain more low back than hip today although only 1/10. Still tender to palpation over piriformis, therefore continued with DTM to piriformis along with STM to both piriformis and hamstrings. Plan to  review progress with patient at next visit to determine need for continued PT vs ability to continue on own with HEP.   PT Next Visit Plan Recertification for continued low back/hip strengthening, ITB STM/stretching, DTM to piriformis PRN vs discharge   Consulted and Agree with Plan of Care Patient        Problem List Patient Active Problem List   Diagnosis Date Noted  . Left hip pain 12/22/2014    [name], PT, MPT 04/10/2015, 9:40 AM  [facility] [address]  [address] [place], [place], [address] Phone: [phone]   Fax:  [phone]

## 2015-04-12 ENCOUNTER — Ambulatory Visit: Payer: Medicare Other | Admitting: Physical Therapy

## 2015-04-12 DIAGNOSIS — R269 Unspecified abnormalities of gait and mobility: Secondary | ICD-10-CM

## 2015-04-12 DIAGNOSIS — M6289 Other specified disorders of muscle: Secondary | ICD-10-CM | POA: Diagnosis not present

## 2015-04-12 DIAGNOSIS — M25552 Pain in left hip: Secondary | ICD-10-CM

## 2015-04-12 DIAGNOSIS — M9905 Segmental and somatic dysfunction of pelvic region: Secondary | ICD-10-CM | POA: Diagnosis not present

## 2015-04-12 DIAGNOSIS — M545 Low back pain, unspecified: Secondary | ICD-10-CM

## 2015-04-12 DIAGNOSIS — R29898 Other symptoms and signs involving the musculoskeletal system: Secondary | ICD-10-CM

## 2015-04-12 NOTE — Therapy (Signed)
[facility] [address]  [place], [place], [address] Phone: [phone]   Fax:  [phone]  Physical Therapy Treatment  Patient Details  Name: Dominique Cruz MRN: [id number] Date of Birth: 1940/09/03 Referring Provider:  Dene Gentry, MD  Encounter Date: 04/12/2015      PT End of Session - 04/12/15 1345    Visit Number 16   Number of Visits 20   Date for PT Re-Evaluation 05/10/15   PT Start Time 7673   PT Stop Time 1401   PT Time Calculation (min) 49 min   Activity Tolerance Patient tolerated treatment well   Behavior During Therapy [facility] for tasks assessed/performed      Past Medical History  Diagnosis Date  . GERD (gastroesophageal reflux disease)   . Osteoporosis   . Swelling     FEET AND LEGS B/L    Past Surgical History  Procedure Laterality Date  . Foot surgery Bilateral     BUNION  . Foot surgery Right     TENDON  . Knee surgery Right     There were no vitals filed for this visit.  Visit Diagnosis:  Weakness of both hips  Left-sided low back pain without sciatica  Left hip pain  Abnormality of gait      Subjective Assessment - 04/12/15 1327    Subjective Patient reports pain is overall improved but does admit to avoiding some pain-inducing activities. Pain continues to limit sitting tolerance and stair ascent.   Limitations Sitting   How long can you sit comfortably? 20 minutes   Currently in Pain? Yes   Pain Score 1   Least 0/10, Avg 1/10 (but avoids painful activities), Worst 4/10   Pain Location Back   Pain Orientation Lower;Left;Mid            [facility] PT Assessment - 04/12/15 1312    Assessment   Next MD Visit 05/03/15   ROM / Strength   AROM / PROM / Strength Strength   Strength   Strength Assessment Site Hip   Right/Left Hip Right;Left   Right Hip Flexion 4/5   Right Hip Extension 4/5   Right Hip ABduction 4/5   Right Hip ADduction 4/5   Left Hip Flexion 4-/5    Left Hip Extension 4-/5   Left Hip ABduction 4/5   Left Hip ADduction 4/5          Today's Treatment  TherEx- Nustep level 5x5' (UE/LE) Standing Hip Abduction with green TB x10, bilateral, HHA on back of chair Standing Hip Extension with green TB x10, bilateral, HHA on back of chair Side-stepping with green TB x20 ft, bilateral Squat 15x3", HHA at edge of sink Bil Fwd step-up on 4" step 10x3", 2 pole A Lt Lat step-up on 4" step 10x3", 2 pole A           PT Education - 04/12/15 1821    Education provided Yes   Education Details Updated HEP + provided green TB for exercises previously completed at home with red TB   Person(s) Educated Patient   Methods Explanation;Demonstration;Handout   Comprehension Verbalized understanding;Returned demonstration          PT Short Term Goals - 03/01/15 1107    PT SHORT TERM GOAL #1   Title Independent with initial HEP (03/21/15)   Status Achieved           PT Long Term Goals - 04/12/15 1318    PT  LONG TERM GOAL #1   Title Independent with advanced HEP (05/10/15)   Status On-going   PT LONG TERM GOAL #2   Title Proximal bilateral LE strength 4/5 or greater for improved balance and stability during gait (05/10/15)   Status On-going   PT LONG TERM GOAL #3   Title Patient will climb stairs reciprocally with or without rail support (05/10/15)   Status Partially Met  Able to ascend reciprocally with left rail support, but requires increased effort with left LE and demos increased pain (up to 4/10); Reciprocal descent with rail with normal step pattern and no pain   PT LONG TERM GOAL #4   Title Patient without sleep disturbance related to low back or hip pain (05/10/15)   Status On-going  Patient reports pain still occasionaly wakes her up a few times per night if she rolls to her side; no disturbance if sleeping on her back               Plan - 04/12/15 1503    Clinical Impression Statement Since initiation of  therapy, patient reporting decreased pain and improving function but continues to experience weakness annd pain with some functional activities, especially stair climbing (able to descend stairs reciprocally without pain but continues to have difficulty lifting self up with left leg when ascending stairs due to continued weakness and pain in left low back/posterior hip). Patient still experiencing some sleep disturbance due to pain when rolling to side during sleep. Initial therapy emphasis was more heavily focused on manual therapy, especially STM to ITB and piriformis secondary to extreme tightness/tenderness, with patient only more recently tolerating progression to more emphasis on strengthening activities. As such all goals not yet met, therefore will extend POC for an additional 1x/wk x 4 weeks to continue strengthening and progression of HEP to all patient to meet remaining goals.   PT Frequency 1x / week   PT Duration --  4 additional weeks   PT Treatment/Interventions Therapeutic exercise;Therapeutic activities;Manual techniques;Functional mobility training;Gait training;Stair training;Balance training;Moist Heat;Ultrasound;Cryotherapy;Electrical Stimulation;Iontophoresis 54m/ml Dexamethasone;ADLs/Self Care Home Management;Patient/family education   PT Next Visit Plan Low back/hip strengthening, STM/DTM to ITB and piriformis PRN   Consulted and Agree with Plan of Care Patient        Problem List Patient Active Problem List   Diagnosis Date Noted  . Left hip pain 12/22/2014    JPercival Spanish PT, MPT 04/12/2015, 6:25 PM  [facility] [place] [address]Phone: [phone]  Fax:  [phone]

## 2015-04-16 ENCOUNTER — Ambulatory Visit: Payer: No Typology Code available for payment source | Admitting: Physician Assistant

## 2015-04-17 ENCOUNTER — Ambulatory Visit: Payer: Medicare Other | Admitting: Rehabilitation

## 2015-04-17 DIAGNOSIS — M6289 Other specified disorders of muscle: Secondary | ICD-10-CM | POA: Diagnosis not present

## 2015-04-17 DIAGNOSIS — M545 Low back pain: Secondary | ICD-10-CM | POA: Diagnosis not present

## 2015-04-17 DIAGNOSIS — M9905 Segmental and somatic dysfunction of pelvic region: Secondary | ICD-10-CM | POA: Diagnosis not present

## 2015-04-19 ENCOUNTER — Ambulatory Visit: Payer: Medicare Other | Admitting: Physical Therapy

## 2015-04-19 DIAGNOSIS — M545 Low back pain, unspecified: Secondary | ICD-10-CM

## 2015-04-19 DIAGNOSIS — R29898 Other symptoms and signs involving the musculoskeletal system: Secondary | ICD-10-CM

## 2015-04-19 DIAGNOSIS — M25552 Pain in left hip: Secondary | ICD-10-CM

## 2015-04-19 DIAGNOSIS — R269 Unspecified abnormalities of gait and mobility: Secondary | ICD-10-CM | POA: Diagnosis not present

## 2015-04-19 DIAGNOSIS — M6289 Other specified disorders of muscle: Secondary | ICD-10-CM | POA: Diagnosis not present

## 2015-04-19 NOTE — Therapy (Signed)
[facility] Outpatient Rehabilitation [facility] [address]  [address] [place], [place], [address] Phone: [phone]   Fax:  [phone]  Physical Therapy Treatment  Patient Details  Name: Aleen Marston MRN: [id number] Date of Birth: 08-26-1940 Referring Provider:  Lenda Kelp, MD  Encounter Date: 04/19/2015      PT End of Session - 04/19/15 1406    Visit Number 17   Number of Visits 20   Date for PT Re-Evaluation 05/10/15   PT Start Time 1400   PT Stop Time 1444   PT Time Calculation (min) 44 min   Activity Tolerance Patient tolerated treatment well   Behavior During Therapy [facility] for tasks assessed/performed      Past Medical History  Diagnosis Date  . GERD (gastroesophageal reflux disease)   . Osteoporosis   . Swelling     FEET AND LEGS B/L    Past Surgical History  Procedure Laterality Date  . Foot surgery Bilateral     BUNION  . Foot surgery Right     TENDON  . Knee surgery Right     There were no vitals filed for this visit.  Visit Diagnosis:  Weakness of both hips  Left-sided low back pain without sciatica  Left hip pain  Abnormality of gait      Subjective Assessment - 04/19/15 1402    Subjective Patient states she is feeling more flexible and pain is much better. Still gets a little sore if she sits for a long time, but not as bad as it used to be. Still has a little trouble sleeping on her side.   Currently in Pain? No/denies            Today's Treatment  TherEx Nustep level 6x7' (UE/LE) Standing 4 way hip (flexion, adduction, abduction, extension) with green TB x10 each, bilateral, 2 pole A Bil Fwd step-up on BOSU 10x3", HHA on counter Bil Lat step-up on BOSU 10x3", HHA on counter  TRX Squat 15x3" Side-stepping with green TB x20 ft, bilateral   Manual Tx Rt Side-Lying STM to Lt hamstring and piriformis Rt Side-Lying DTM to Lt piriformis            PT Short Term Goals - 03/01/15 1107    PT  SHORT TERM GOAL #1   Title Independent with initial HEP (03/21/15)   Status Achieved           PT Long Term Goals - 04/19/15 1504    PT LONG TERM GOAL #1   Title Independent with advanced HEP (05/10/15)   Status On-going   PT LONG TERM GOAL #2   Title Proximal bilateral LE strength 4/5 or greater for improved balance and stability during gait (05/10/15)   Status On-going   PT LONG TERM GOAL #3   Title Patient will climb stairs reciprocally with or without rail support (05/10/15)   Status On-going   PT LONG TERM GOAL #4   Title Patient without sleep disturbance related to low back or hip pain (05/10/15)   Status On-going               Plan - 04/19/15 1501    Clinical Impression Statement Patient pleased with continuing improvement in pain and increasing activity tolerance. Tolerating increased resistance with exercises along with exercise progressionwith mild fatigue but no c/o pain.    PT Next Visit Plan Low back/hip strengthening, STM/DTM to ITB and piriformis PRN   Consulted and Agree with Plan of Care  Patient        Problem List Patient Active Problem List   Diagnosis Date Noted  . Left hip pain 12/22/2014    [name] 04/19/2015, 3:05 PM  [facility] [address]  [address] [place], [place], [address] Phone: [phone]   Fax:  [phone]

## 2015-04-24 ENCOUNTER — Ambulatory Visit: Payer: Medicare Other | Admitting: Physical Therapy

## 2015-04-24 DIAGNOSIS — M25552 Pain in left hip: Secondary | ICD-10-CM | POA: Diagnosis not present

## 2015-04-24 DIAGNOSIS — M545 Low back pain, unspecified: Secondary | ICD-10-CM

## 2015-04-24 DIAGNOSIS — R269 Unspecified abnormalities of gait and mobility: Secondary | ICD-10-CM | POA: Diagnosis not present

## 2015-04-24 DIAGNOSIS — M6289 Other specified disorders of muscle: Secondary | ICD-10-CM | POA: Diagnosis not present

## 2015-04-24 DIAGNOSIS — R29898 Other symptoms and signs involving the musculoskeletal system: Secondary | ICD-10-CM

## 2015-04-24 NOTE — Therapy (Signed)
[facility] [address]  [address] [place], [place], [address] Phone: [phone]   Fax:  [phone]  Physical Therapy Treatment  Patient Details  Name: Dominique Cruz MRN: [id number] Date of Birth: 1941/03/23 Referring Provider:  Lenda Kelp, MD  Encounter Date: 04/24/2015      PT End of Session - 04/24/15 0847    Visit Number 18   Number of Visits 20   Date for PT Re-Evaluation 05/10/15   PT Start Time 0843   PT Stop Time 0935   PT Time Calculation (min) 52 min   Activity Tolerance Patient tolerated treatment well   Behavior During Therapy [facility] for tasks assessed/performed      Past Medical History  Diagnosis Date  . GERD (gastroesophageal reflux disease)   . Osteoporosis   . Swelling     FEET AND LEGS B/L    Past Surgical History  Procedure Laterality Date  . Foot surgery Bilateral     BUNION  . Foot surgery Right     TENDON  . Knee surgery Right     There were no vitals filed for this visit.  Visit Diagnosis:  Weakness of both hips  Left-sided low back pain without sciatica  Left hip pain  Abnormality of gait      Subjective Assessment - 04/24/15 0846    Subjective Patient continues to deny pain, only stiffness. States she felt good after last treatment, but was limping some on Friday after being on feet all day cleaning the house.   Currently in Pain? No/denies            Today's Treatment  TherEx Nustep level 6x6' (UE/LE) Standing 4 way hip (flexion, adduction, abduction, extension) with green TB x10 each, bilateral, 2 pole A Bil Fwd step-up on BOSU 10x3", HHA on counter Bil Lat step-up on BOSU 10x3", HHA on counter  TRX Squat 15x3" Side-stepping with green TB x20 ft, bilateral Fitter (2 blue) hip abduction & hip extension 10x3", bilateral   Manual Tx Rt Side-Lying STM to Lt ITB, hamstring and piriformis Rt Side-Lying DTM to Lt piriformis Stretch Lt SKTC, KTOS,  Piriformis, ITB, Hamstring 3x20" (manually)             PT Short Term Goals - 03/01/15 1107    PT SHORT TERM GOAL #1   Title Independent with initial HEP (03/21/15)   Status Achieved           PT Long Term Goals - 04/24/15 5366    PT LONG TERM GOAL #1   Title Independent with advanced HEP (05/10/15)   Status On-going   PT LONG TERM GOAL #2   Title Proximal bilateral LE strength 4/5 or greater for improved balance and stability during gait (05/10/15)   Status On-going   PT LONG TERM GOAL #3   Title Patient will climb stairs reciprocally with or without rail support (05/10/15)   Status On-going   PT LONG TERM GOAL #4   Title Patient without sleep disturbance related to low back or hip pain (05/10/15)   Status On-going               Plan - 04/24/15 0946    Clinical Impression Statement Continued increased tension in piriformis and mid ITB which remains slow to respond to STM/DTM and stretching. Pain remains pretty well controlled, typically only present after fatigue (ie. full day spent cleaning house). Tolerating progression of therapeutic exercises without complaints.   PT  Next Visit Plan Low back/hip strengthening, STM/DTM to ITB and piriformis PRN, *MD note for appt 9/29*   Consulted and Agree with Plan of Care Patient        Problem List Patient Active Problem List   Diagnosis Date Noted  . Left hip pain 12/22/2014    [name], PT, MPT 04/24/2015, 9:58 AM  [facility] [address]  [address] [place], [place], [address] Phone: [phone]   Fax:  [phone]

## 2015-04-25 ENCOUNTER — Ambulatory Visit: Payer: No Typology Code available for payment source | Admitting: Family Medicine

## 2015-05-01 ENCOUNTER — Ambulatory Visit: Payer: Medicare Other | Admitting: Physical Therapy

## 2015-05-01 DIAGNOSIS — R269 Unspecified abnormalities of gait and mobility: Secondary | ICD-10-CM

## 2015-05-01 DIAGNOSIS — M545 Low back pain, unspecified: Secondary | ICD-10-CM

## 2015-05-01 DIAGNOSIS — M25552 Pain in left hip: Secondary | ICD-10-CM | POA: Diagnosis not present

## 2015-05-01 DIAGNOSIS — M6289 Other specified disorders of muscle: Secondary | ICD-10-CM | POA: Diagnosis not present

## 2015-05-01 DIAGNOSIS — R29898 Other symptoms and signs involving the musculoskeletal system: Secondary | ICD-10-CM

## 2015-05-01 NOTE — Therapy (Signed)
[facility] [address]  [place], [place], [address] Phone: [phone]   Fax:  [phone]  Physical Therapy Treatment  Patient Details  Name: Dominique Cruz MRN: [id number] Date of Birth: [birth year] Referring Provider:  Dene Gentry, MD  Encounter Date: 05/01/2015      PT End of Session - 05/01/15 0859    Visit Number 19   Number of Visits 20   Date for PT Re-Evaluation 05/10/15   PT Start Time 0849   PT Stop Time 0928   PT Time Calculation (min) 39 min   Activity Tolerance Patient tolerated treatment well   Behavior During Therapy [facility] for tasks assessed/performed      Past Medical History  Diagnosis Date  . GERD (gastroesophageal reflux disease)   . Osteoporosis   . Swelling     FEET AND LEGS B/L    Past Surgical History  Procedure Laterality Date  . Foot surgery Bilateral     BUNION  . Foot surgery Right     TENDON  . Knee surgery Right     There were no vitals filed for this visit.  Visit Diagnosis:  Weakness of both hips  Left-sided low back pain without sciatica  Left hip pain  Abnormality of gait      Subjective Assessment - 05/01/15 0857    Subjective Patient reports very mild pain this morning, but none yesterday. Went to the beach for the end of last week and able to walk around quite a bit without any pain. Patient states pain only intermittent at present.    Currently in Pain? Yes   Pain Score 1    Pain Location Sacrum   Pain Orientation Left            [facility] PT Assessment - 05/01/15 0849    ROM / Strength   AROM / PROM / Strength Strength   Strength   Strength Assessment Site Hip   Right/Left Hip Right;Left   Right Hip Flexion 4/5   Right Hip Extension 4-/5   Right Hip ABduction 4/5   Right Hip ADduction 4/5   Left Hip Flexion 4/5   Left Hip Extension 4-/5   Left Hip ABduction 4/5   Left Hip ADduction 4-/5           Today's Treatment  MMT  check  TherEx Nustep level 6x6' (UE/LE) Bil Fwd step-up on BOSU 10x3", HHA on counter Bil Lat step-up on BOSU 10x3", HHA on counter  TRX Squat 15x3" Bridging 10x3" Lt SL Bridge 2x5 with 3" hold  Manual Tx Rt Side-Lying STM to Lt ITB, hamstring and piriformis Rt Side-Lying DTM to Lt piriformis Stretch Lt SKTC, KTOS, Piriformis, ITB, Hamstring 3x20" (manually)          PT Short Term Goals - 03/01/15 1107    PT SHORT TERM GOAL #1   Title Independent with initial HEP (03/21/15)   Status Achieved           PT Long Term Goals - 05/01/15 1224    PT LONG TERM GOAL #1   Title Independent with advanced HEP (05/10/15)   Status On-going   PT LONG TERM GOAL #2   Title Proximal bilateral LE strength 4/5 or greater for improved balance and stability during gait (05/10/15)   Status Partially Met   PT LONG TERM GOAL #3   Title Patient will climb stairs reciprocally with or without rail support (05/10/15)   Status Achieved  PT LONG TERM GOAL #4   Title Patient without sleep disturbance related to low back or hip pain (05/10/15)   Status Achieved               Plan - 05/01/15 1104    Clinical Impression Statement Patient has demonstrated good progress with PT with decreased overall low back and hip pain, although still experiences intermittent left posterior hip/SI pain, typically only 1/10, secondary to persistant tightness and irritation in left piriformis which has been very slow to respond to STM/DTM. Overall bilateral LE strength improved to grossly 4-/5 to 4/5 with patient demonstrating functional improvement in mobility especially in ability to ascend and descend stairs reciprocally with or without railing. Patient also reporting decreased sleep disturbance, with no pain if sleeping on her back but occasionally awakens if rolls into certain position on left side. Given current progress and Medicare therapy cap, will plan to discharge at next visit after final review/update of  HEP.   PT Next Visit Plan HEP review/update, discharge assessment   Consulted and Agree with Plan of Care Patient        Problem List Patient Active Problem List   Diagnosis Date Noted  . Left hip pain 12/22/2014    Percival Spanish 05/01/2015, 11:20 AM  [facility] [address]  [place], [place], [address] Phone: [phone]   Fax:  [phone]

## 2015-05-02 ENCOUNTER — Ambulatory Visit: Payer: No Typology Code available for payment source | Admitting: Family Medicine

## 2015-05-02 DIAGNOSIS — M545 Low back pain: Secondary | ICD-10-CM | POA: Diagnosis not present

## 2015-05-02 DIAGNOSIS — M6289 Other specified disorders of muscle: Secondary | ICD-10-CM | POA: Diagnosis not present

## 2015-05-02 DIAGNOSIS — M9905 Segmental and somatic dysfunction of pelvic region: Secondary | ICD-10-CM | POA: Diagnosis not present

## 2015-05-03 ENCOUNTER — Encounter: Payer: Self-pay | Admitting: Family Medicine

## 2015-05-03 ENCOUNTER — Ambulatory Visit: Payer: No Typology Code available for payment source | Admitting: Family Medicine

## 2015-05-03 ENCOUNTER — Ambulatory Visit (INDEPENDENT_AMBULATORY_CARE_PROVIDER_SITE_OTHER): Payer: Medicare Other | Admitting: Family Medicine

## 2015-05-03 VITALS — BP 116/75 | HR 65 | Ht 64.0 in | Wt 167.0 lb

## 2015-05-03 DIAGNOSIS — M25552 Pain in left hip: Secondary | ICD-10-CM | POA: Diagnosis not present

## 2015-05-03 NOTE — Assessment & Plan Note (Signed)
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  Did extremely well with PT - has one visit left.  Encouraged to do home exercises 3 times a week for another 6 weeks at least.  Follow up with [place] as needed.

## 2015-05-03 NOTE — Progress Notes (Signed)
PCP: Georgann Housekeeper, MD  Subjective:   HPI: Patient is a 74 y.o. female here for left hip pain.  5/18: Patient reports for about 4 months she's had posterior left hip pain. No known injury or trauma. Leg feels weak and has been since she was young. Tried tylenol when this throbs. Hasn't tried any exercises, stretches.  7/6: Patient reports she's about 60% improved from last visit. Left leg still feels weak especially with stairs. Doing home exercises. No new injuries or complaints.  8/17: Patient reports she's overall doing well. Frustrated that she's still dealing with pain despite PT, doing home exercises. Left leg still weak. Pain up to 4/10 with walking, none at rest. Can radiate from left buttock down left leg.  9/29: Patient reports she feels significantly better. Some soreness using stairs. Pain 0/10 currently though. No associated numbness or tingling. Pain posterior left hip area when this bothers her.  Past Medical History  Diagnosis Date  . GERD (gastroesophageal reflux disease)   . Osteoporosis   . Swelling     FEET AND LEGS B/L    Current Outpatient Prescriptions on File Prior to Visit  Medication Sig Dispense Refill  . acetaminophen (TYLENOL) 650 MG CR tablet Take 650 mg by mouth every 8 (eight) hours as needed for pain.    Marland Kitchen aspirin 81 MG tablet Take 81 mg by mouth daily.    . Calcium Carbonate-Vitamin D (CALTRATE 600+D) 600-400 MG-UNIT per tablet Take 1 tablet by mouth daily.      . cholecalciferol (VITAMIN D) 1000 UNITS tablet Take 1,000 Units by mouth daily.      . fish oil-omega-3 fatty acids 1000 MG capsule Take 1 g by mouth daily.      . fluticasone (FLONASE) 50 MCG/ACT nasal spray     . ibuprofen (ADVIL,MOTRIN) 200 MG tablet Take 400 mg by mouth every 6 (six) hours as needed. For pain     . magnesium oxide (MAG-OX) 400 MG tablet Take 400 mg by mouth daily.      . Multiple Vitamin (MULTIVITAMIN) tablet Take 1 tablet by mouth daily.      Marland Kitchen  oxybutynin (DITROPAN-XL) 5 MG 24 hr tablet Take 5 mg by mouth daily.      . pantoprazole (PROTONIX) 40 MG tablet     . Potassium 99 MG TABS Take 1 tablet by mouth daily.      Marland Kitchen pyridoxine (B-6) 100 MG tablet Take 100 mg by mouth daily.      Marland Kitchen triamcinolone ointment (KENALOG) 0.1 %     . vitamin B-12 (CYANOCOBALAMIN) 1000 MCG tablet Take 1,000 mcg by mouth daily.      . vitamin E 400 UNIT capsule Take 400 Units by mouth daily.       No current facility-administered medications on file prior to visit.    Past Surgical History  Procedure Laterality Date  . Foot surgery Bilateral     BUNION  . Foot surgery Right     TENDON  . Knee surgery Right     Allergies  Allergen Reactions  . Ao-Disc Catalyst     Eye swelled shut  . Sulfa Drugs Cross Reactors     unknown    Social History   Social History  . Marital Status: Married    Spouse Name: N/A  . Number of Children: N/A  . Years of Education: N/A   Occupational History  . Not on file.   Social History Main Topics  . Smoking status: Former  Smoker    Types: Cigarettes    Quit date: 05/11/1961  . Smokeless tobacco: Never Used  . Alcohol Use: 0.0 oz/week    0 Standard drinks or equivalent per week     Comment: WINE 3 TIMES WEEKLY  . Drug Use: No  . Sexual Activity: Not on file   Other Topics Concern  . Not on file   Social History Narrative    No family history on file.  BP 116/75 mmHg  Pulse 65  Ht  (1.626 m)  Wt 167 lb (75.751 kg)  BMI 28.65 kg/m2  Review of Systems: See HPI above.    Objective:  Physical Exam:  Gen: NAD  Left hip: No gross deformity, swelling, bruising. Minimal TTP piriformis, SI, greater trochanter.  No back, other tenderness.  No midline or bony TTP. FROM hip without pain passively. Strength 4/5 with hip abduction, straight leg raise.  5/5 other motions. Negative SLRs. Sensation intact to light touch bilaterally. Negative logroll bilateral hips Negative fabers.  Feels  stretch in area of pain on left with piriformis.  Right hip: FROM without pain.    Assessment & Plan:  1. 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  Did extremely well with PT - has one visit left.  Encouraged to do home exercises 3 times a week for another 6 weeks at least.  Follow up with [place] as needed.

## 2015-05-08 ENCOUNTER — Ambulatory Visit: Payer: Medicare Other | Attending: Family Medicine | Admitting: Physical Therapy

## 2015-05-08 DIAGNOSIS — R29898 Other symptoms and signs involving the musculoskeletal system: Secondary | ICD-10-CM

## 2015-05-08 DIAGNOSIS — M545 Low back pain, unspecified: Secondary | ICD-10-CM

## 2015-05-08 DIAGNOSIS — M6289 Other specified disorders of muscle: Secondary | ICD-10-CM | POA: Insufficient documentation

## 2015-05-08 DIAGNOSIS — R269 Unspecified abnormalities of gait and mobility: Secondary | ICD-10-CM | POA: Diagnosis not present

## 2015-05-08 DIAGNOSIS — M25552 Pain in left hip: Secondary | ICD-10-CM | POA: Insufficient documentation

## 2015-05-08 NOTE — Therapy (Signed)
[facility] [address]  [place], [place], [address] Phone: [phone]   Fax:  [phone]  Physical Therapy Treatment  Patient Details  Name: Dominique Cruz MRN: [id number] Date of Birth: 1940/09/30 Referring Provider:  Dene Gentry, MD  Encounter Date: 05/08/2015      PT End of Session - 05/08/15 0850    Visit Number 20   Number of Visits 20   Date for PT Re-Evaluation 05/10/15   PT Start Time 1245   PT Stop Time 0934   PT Time Calculation (min) 47 min   Activity Tolerance Patient tolerated treatment well   Behavior During Therapy [facility] for tasks assessed/performed      Past Medical History  Diagnosis Date  . GERD (gastroesophageal reflux disease)   . Osteoporosis   . Swelling     FEET AND LEGS B/L    Past Surgical History  Procedure Laterality Date  . Foot surgery Bilateral     BUNION  . Foot surgery Right     TENDON  . Knee surgery Right     There were no vitals filed for this visit.  Visit Diagnosis:  Weakness of both hips  Left-sided low back pain without sciatica  Left hip pain  Abnormality of gait      Subjective Assessment - 05/08/15 0850    Currently in Pain? Yes   Pain Score --  <1/10            [facility] PT Assessment - 05/08/15 0847    Observation/Other Assessments   Focus on Therapeutic Outcomes (FOTO)  59% (41% limitation)   ROM / Strength   AROM / PROM / Strength Strength   Strength   Strength Assessment Site Hip   Right/Left Hip Right;Left   Right Hip Flexion 4/5   Right Hip Extension 4/5   Right Hip ABduction 4+/5   Right Hip ADduction 4/5   Left Hip Flexion 4/5   Left Hip Extension 4-/5   Left Hip ABduction 4+/5   Left Hip ADduction 4/5           Today's Treatment  TherEx Leg press DL 25# 10x3", SL (Lt) 15# 10x3" Cybex knee extension DL 15# 10x3" Cybex knee flexion DL 15# 10x3" Standing 4 way hip (flexion, adduction, abduction, extension)  with green TB 10x3" each, bilateral, 2 pole A Bil Fwd step-up on 6" step 10x3", 2 pole A Bil Lat step-up on 6" step 10x3", 2 pole A Squat 15x3", HHA on edge of sink Bridging 10x3" Lt SL Bridge 2x5 with 3" hold Foam roller ITB & piriformis stretch on floor          PT Education - 05/08/15 1122    Education Details Updated HEP with instructions for continued progression on own at home; Use of gym equipment inlcuding weight machines for strenghtening and foam roller for self-stretches   Person(s) Educated Patient   Methods Explanation;Demonstration;Handout   Comprehension Verbalized understanding;Returned demonstration          PT Short Term Goals - 03/01/15 1107    PT SHORT TERM GOAL #1   Title Independent with initial HEP (03/21/15)   Status Achieved           PT Long Term Goals - 05/08/15 0929    PT LONG TERM GOAL #1   Title Independent with advanced HEP (05/10/15)   Status Achieved   PT LONG TERM GOAL #2   Title Proximal bilateral LE strength 4/5  or greater for improved balance and stability during gait (05/10/15)   Status Achieved   PT LONG TERM GOAL #3   Title Patient will climb stairs reciprocally with or without rail support (05/10/15)   Status Achieved   PT LONG TERM GOAL #4   Title Patient without sleep disturbance related to low back or hip pain (05/10/15)   Status Achieved               Plan - 2015-05-24 1052    Clinical Impression Statement Patient has demonstrated good progress with PT with decreased overall low back and hip pain, although still experiences intermittent left posterior hip/SI pain, typically <1/10 per patient report. Patient continues to have some persistent tightness and irritation in left piriformis and ITB, but is aware of techniques for self-stretching including use of foam roller for self STM. Overall bilateral LE strength improved to grossly 4/5 with excepting of left hip extension 4-/5, and is independent in continued home  strengthening program. Functional improvement observed in ability to ascend and descend stairs reciprocally with or without railing. Patient also reporting decreased sleep disturbance, with no pain if sleeping on her back but occasionally awakens if lays too long on left side. Education provided in HEP progression and transition to gym program. All goals essentially met and patient very satisfied with her progress with therapy, therefore will proceed with discharge from PT for this episode.   PT Next Visit Plan D/C          G-Codes - 05-24-2015 1050    Functional Assessment Tool Used FOTO = 59% (41% limitation)   Functional Limitation Mobility: Walking and moving around   Mobility: Walking and Moving Around Current Status [phone]) At least 40 percent but less than 60 percent impaired, limited or restricted   Mobility: Walking and Moving Around Goal Status [phone]) At least 40 percent but less than 60 percent impaired, limited or restricted   Mobility: Walking and Moving Around Discharge Status [phone]) At least 40 percent but less than 60 percent impaired, limited or restricted      Problem List Patient Active Problem List   Diagnosis Date Noted  . Left hip pain 12/22/2014    Percival Spanish, PT, MPT 2015-05-24, 11:26 AM  [facility] [place], [place], [address] Phone: [phone]   Fax:  [phone]    PHYSICAL THERAPY DISCHARGE SUMMARY  Visits from Start of Care: 20  Current functional level related to goals / functional outcomes:  Patient has demonstrated good progress with PT with decreased overall low back and hip pain, although still experiences intermittent left posterior hip/SI pain, typically <1/10 per patient report. Patient continues to have some persistent tightness and irritation in left piriformis and ITB, but is aware of techniques for self-stretching including use of foam roller for self STM.  Overall bilateral LE strength improved to grossly 4/5 with excepting of left hip extension 4-/5, and patient is independent in continued home strengthening program. Functional improvement observed in ability to ascend and descend stairs reciprocally with or without railing. Patient also reporting decreased sleep disturbance, with no pain if sleeping on her back but occasionally awakens if lays too long on left side. All goals essentially met and patient very satisfied with her progress with therapy, therefore will proceed with discharge from PT for this episode.   Remaining deficits:  Mild tightness in left piriformis & ITB with pain <1/10   Education / Equipment:  Progressive HEP and transition to Gym program Plan: Patient agrees to discharge.  Patient goals were met. Patient is being discharged due to meeting the stated rehab goals.  ?????       Percival Spanish, PT, MPT 05/08/2015, 11:32 AM  [facility] [address]  [place], [place], [address] Phone: [phone]   Fax:  [phone]

## 2015-05-21 ENCOUNTER — Ambulatory Visit (HOSPITAL_BASED_OUTPATIENT_CLINIC_OR_DEPARTMENT_OTHER)
Admission: RE | Admit: 2015-05-21 | Discharge: 2015-05-21 | Disposition: A | Discharge status: Home / Self Care | Payer: Medicare Other | Source: Ambulatory Visit | Attending: Chiropractic Medicine | Admitting: Chiropractic Medicine

## 2015-05-21 ENCOUNTER — Other Ambulatory Visit (HOSPITAL_BASED_OUTPATIENT_CLINIC_OR_DEPARTMENT_OTHER): Payer: Self-pay | Admitting: Chiropractic Medicine

## 2015-05-21 DIAGNOSIS — M25552 Pain in left hip: Secondary | ICD-10-CM | POA: Diagnosis not present

## 2015-05-21 DIAGNOSIS — M9905 Segmental and somatic dysfunction of pelvic region: Secondary | ICD-10-CM | POA: Diagnosis not present

## 2015-05-21 DIAGNOSIS — M545 Low back pain: Secondary | ICD-10-CM | POA: Diagnosis not present

## 2015-05-23 ENCOUNTER — Ambulatory Visit: Payer: No Typology Code available for payment source | Admitting: Physician Assistant

## 2015-05-23 DIAGNOSIS — Z23 Encounter for immunization: Secondary | ICD-10-CM | POA: Diagnosis not present

## 2015-05-23 DIAGNOSIS — M545 Low back pain: Secondary | ICD-10-CM | POA: Diagnosis not present

## 2015-05-23 DIAGNOSIS — M9905 Segmental and somatic dysfunction of pelvic region: Secondary | ICD-10-CM | POA: Diagnosis not present

## 2015-05-30 DIAGNOSIS — M545 Low back pain: Secondary | ICD-10-CM | POA: Diagnosis not present

## 2015-05-30 DIAGNOSIS — M9905 Segmental and somatic dysfunction of pelvic region: Secondary | ICD-10-CM | POA: Diagnosis not present

## 2015-06-04 DIAGNOSIS — M9905 Segmental and somatic dysfunction of pelvic region: Secondary | ICD-10-CM | POA: Diagnosis not present

## 2015-06-04 DIAGNOSIS — M545 Low back pain: Secondary | ICD-10-CM | POA: Diagnosis not present

## 2015-06-05 ENCOUNTER — Encounter: Payer: Self-pay | Admitting: Physician Assistant

## 2015-06-05 ENCOUNTER — Ambulatory Visit (INDEPENDENT_AMBULATORY_CARE_PROVIDER_SITE_OTHER): Payer: Medicare Other | Admitting: Physician Assistant

## 2015-06-05 VITALS — BP 128/82 | HR 64 | Temp 97.9°F | Resp 16 | Ht 64.0 in | Wt 172.0 lb

## 2015-06-05 DIAGNOSIS — N3281 Overactive bladder: Secondary | ICD-10-CM | POA: Diagnosis not present

## 2015-06-05 DIAGNOSIS — J302 Other seasonal allergic rhinitis: Secondary | ICD-10-CM | POA: Diagnosis not present

## 2015-06-05 DIAGNOSIS — K219 Gastro-esophageal reflux disease without esophagitis: Secondary | ICD-10-CM | POA: Diagnosis not present

## 2015-06-05 NOTE — Progress Notes (Signed)
Patient presents to clinic today to establish care.  Acute Concerns: No other concerns today.  Chronic Issues: GERD -- Previously on Protonix 20 mg daily. Is watching diet and taking some licorice extract to help. Denies breakthrough symptoms with current regimen.   OAB -- Currently on Oxybutynin 2.5 mg with good relief of symptoms. Denies incontinence or retention. Urinates well.  Seasonal Allergies -- Mild. Controlled with OTC Flonase.  Health Maintenance: Immunizations -- Flu shot up-to-date. Patient unsure of last Tetanus but states it was within last 5 year. Will get records. Colonoscopy -- Endorses last in 2011. Mother with colon cancer. Is on every 5 year schedule Mammogram -- Endorses 02/2015. Normal per patient. Bone Density -- Endorses last in 2015. Osteopenia per patient.   Past Medical History  Diagnosis Date  . GERD (gastroesophageal reflux disease)   . Swelling     FEET AND LEGS B/L  . Overactive bladder   . History of chicken pox   . Mumps   . Weakness of left leg   . PE (pulmonary embolism)     2012  . Elevated blood pressure (not hypertension)     Past Surgical History  Procedure Laterality Date  . Foot surgery Bilateral     BUNION  . Foot surgery Right     TENDON  . Knee surgery Right     Current Outpatient Prescriptions on File Prior to Visit  Medication Sig Dispense Refill  . acetaminophen (TYLENOL) 650 MG CR tablet Take 650 mg by mouth every 8 (eight) hours as needed for pain.    Marland Kitchen. aspirin 81 MG tablet Take 81 mg by mouth daily.    . Calcium Carbonate-Vitamin D (CALTRATE 600+D) 600-400 MG-UNIT per tablet Take 2 tablets by mouth daily.     . cholecalciferol (VITAMIN D) 1000 UNITS tablet Take 1,000 Units by mouth daily.      . fluticasone (FLONASE) 50 MCG/ACT nasal spray daily as needed.     . magnesium oxide (MAG-OX) 400 MG tablet Take 400 mg by mouth daily.      . Multiple Vitamin (MULTIVITAMIN) tablet Take 1 tablet by mouth daily.      Marland Kitchen.  oxybutynin (DITROPAN-XL) 5 MG 24 hr tablet Take 2.5 mg by mouth daily.     . pantoprazole (PROTONIX) 40 MG tablet Take 20 mg by mouth daily.     Marland Kitchen. pyridoxine (B-6) 100 MG tablet Take 100 mg by mouth daily.      . vitamin B-12 (CYANOCOBALAMIN) 1000 MCG tablet Take 1,000 mcg by mouth daily.      . vitamin E 400 UNIT capsule Take 400 Units by mouth daily.      . fish oil-omega-3 fatty acids 1000 MG capsule Take 1 g by mouth daily.      . Potassium 99 MG TABS Take 1 tablet by mouth daily.       No current facility-administered medications on file prior to visit.    Allergies  Allergen Reactions  . Ao-Disc Catalyst     Eye swelled shut  . Sulfa Drugs Cross Reactors     unknown    Family History  Problem Relation Age of Onset  . Arthritis Father   . Colon cancer Mother   . Heart disease Father   . Heart disease Mother   . Mental illness Mother   . Allergies Mother   . Allergies Father   . Multiple sclerosis Brother   . Prostate cancer Brother   . Stroke Brother   .  Healthy Son     x1  . Healthy Daughter     x1    Social History   Social History  . Marital Status: Married    Spouse Name: N/A  . Number of Children: 2  . Years of Education: N/A   Occupational History  . Retired    Social History Main Topics  . Smoking status: Former Smoker    Types: Cigarettes    Quit date: 05/11/1961  . Smokeless tobacco: Never Used  . Alcohol Use: 0.0 oz/week    0 Standard drinks or equivalent per week     Comment: WINE 3 TIMES WEEKLY  . Drug Use: No  . Sexual Activity: Yes   Other Topics Concern  . Not on file   Social History Narrative   Review of Systems  Constitutional: Negative for fever and weight loss.  HENT: Negative for ear discharge, ear pain, hearing loss and tinnitus.   Eyes: Negative for blurred vision, double vision, photophobia and pain.  Respiratory: Negative for cough and shortness of breath.   Cardiovascular: Negative for chest pain and palpitations.    Gastrointestinal: Positive for heartburn. Negative for nausea, vomiting, abdominal pain, diarrhea, constipation, blood in stool and melena.  Genitourinary: Negative for dysuria, urgency, frequency, hematuria and flank pain.  Musculoskeletal: Negative for falls.  Neurological: Negative for dizziness, loss of consciousness and headaches.  Endo/Heme/Allergies: Negative for environmental allergies.  Psychiatric/Behavioral: Negative for depression, suicidal ideas, hallucinations and substance abuse. The patient is not nervous/anxious and does not have insomnia.    BP 128/82 mmHg  Pulse 64  Temp(Src) 97.9 F (36.6 C) (Oral)  Resp 16  Ht  (1.626 m)  Wt 172 lb (78.019 kg)  BMI 29.51 kg/m2  SpO2 98%  Physical Exam  Constitutional: She is oriented to person, place, and time and well-developed, well-nourished, and in no distress.  HENT:  Head: Normocephalic and atraumatic.  Right Ear: External ear normal.  Left Ear: External ear normal.  Nose: Nose normal.  Mouth/Throat: Oropharynx is clear and moist. No oropharyngeal exudate.  TM within normal limits bilaterally.  Eyes: Conjunctivae are normal. Pupils are equal, round, and reactive to light.  Neck: Neck supple.  Cardiovascular: Normal rate, regular rhythm, normal heart sounds and intact distal pulses.   Pulmonary/Chest: Effort normal and breath sounds normal. No respiratory distress. She has no wheezes. She has no rales. She exhibits no tenderness.  Neurological: She is alert and oriented to person, place, and time.  Skin: Skin is warm and dry. No rash noted.  Psychiatric: Affect normal.  Vitals reviewed.  No results found for this or any previous visit (from the past 2160 hour(s)).  Assessment/Plan: Gastroesophageal reflux disease without esophagitis Doing well overall with decreased PPI dose. Will continue attempting to wean off of medication. Probiotic recommended. Diet discussed. Follow-up PRN.  OAB (overactive  bladder) Continue decreased dose of the Ditropan. Follow-up when due for a CPE. Will obtain records from previous PCP so immunizations, HM and history can be further abstracted.  Seasonal allergies Well-controlled. Continue current regimen.

## 2015-06-05 NOTE — Assessment & Plan Note (Signed)
Well-controlled.  Continue current regimen. 

## 2015-06-05 NOTE — Patient Instructions (Signed)
Please continue medications as directed with the following exceptions: Continue the lower dose of the Oxybutynin XL daily. Continue weaning off of the Protonix -- we can always restart if symptoms recur.  Follow-up with Dr. Pearletha ForgeHudnall. I will call once I have received your records.

## 2015-06-05 NOTE — Assessment & Plan Note (Signed)
Doing well overall with decreased PPI dose. Will continue attempting to wean off of medication. Probiotic recommended. Diet discussed. Follow-up PRN.

## 2015-06-05 NOTE — Progress Notes (Signed)
Pre visit review using our clinic review tool, if applicable. No additional management support is needed unless otherwise documented below in the visit note/SLS  

## 2015-06-05 NOTE — Assessment & Plan Note (Signed)
Continue decreased dose of the Ditropan. Follow-up when due for a CPE. Will obtain records from previous PCP so immunizations, HM and history can be further abstracted.

## 2015-06-08 DIAGNOSIS — M9905 Segmental and somatic dysfunction of pelvic region: Secondary | ICD-10-CM | POA: Diagnosis not present

## 2015-06-08 DIAGNOSIS — M545 Low back pain: Secondary | ICD-10-CM | POA: Diagnosis not present

## 2015-06-11 ENCOUNTER — Telehealth: Payer: Self-pay | Admitting: *Deleted

## 2015-06-11 NOTE — Telephone Encounter (Signed)
Forwarded to Cody/Sharon. JG//CMA 

## 2015-06-12 DIAGNOSIS — M9905 Segmental and somatic dysfunction of pelvic region: Secondary | ICD-10-CM | POA: Diagnosis not present

## 2015-06-12 DIAGNOSIS — M545 Low back pain: Secondary | ICD-10-CM | POA: Diagnosis not present

## 2015-06-22 ENCOUNTER — Encounter: Payer: Self-pay | Admitting: Physician Assistant

## 2015-07-26 DIAGNOSIS — Z961 Presence of intraocular lens: Secondary | ICD-10-CM | POA: Diagnosis not present

## 2015-08-13 ENCOUNTER — Emergency Department (HOSPITAL_BASED_OUTPATIENT_CLINIC_OR_DEPARTMENT_OTHER): Payer: Medicare Other

## 2015-08-13 ENCOUNTER — Telehealth: Payer: Self-pay | Admitting: Physician Assistant

## 2015-08-13 ENCOUNTER — Emergency Department (HOSPITAL_BASED_OUTPATIENT_CLINIC_OR_DEPARTMENT_OTHER)
Admission: EM | Admit: 2015-08-13 | Discharge: 2015-08-13 | Disposition: A | Discharge status: Home / Self Care | Payer: Medicare Other | Attending: Emergency Medicine | Admitting: Emergency Medicine

## 2015-08-13 ENCOUNTER — Encounter (HOSPITAL_BASED_OUTPATIENT_CLINIC_OR_DEPARTMENT_OTHER): Payer: Self-pay

## 2015-08-13 ENCOUNTER — Telehealth: Payer: Self-pay

## 2015-08-13 DIAGNOSIS — Z7951 Long term (current) use of inhaled steroids: Secondary | ICD-10-CM | POA: Insufficient documentation

## 2015-08-13 DIAGNOSIS — Z79899 Other long term (current) drug therapy: Secondary | ICD-10-CM | POA: Diagnosis not present

## 2015-08-13 DIAGNOSIS — R079 Chest pain, unspecified: Secondary | ICD-10-CM | POA: Insufficient documentation

## 2015-08-13 DIAGNOSIS — Z8619 Personal history of other infectious and parasitic diseases: Secondary | ICD-10-CM | POA: Diagnosis not present

## 2015-08-13 DIAGNOSIS — K219 Gastro-esophageal reflux disease without esophagitis: Secondary | ICD-10-CM | POA: Diagnosis not present

## 2015-08-13 DIAGNOSIS — Z7982 Long term (current) use of aspirin: Secondary | ICD-10-CM | POA: Insufficient documentation

## 2015-08-13 DIAGNOSIS — Z8679 Personal history of other diseases of the circulatory system: Secondary | ICD-10-CM | POA: Diagnosis not present

## 2015-08-13 DIAGNOSIS — Z86711 Personal history of pulmonary embolism: Secondary | ICD-10-CM

## 2015-08-13 DIAGNOSIS — Z87891 Personal history of nicotine dependence: Secondary | ICD-10-CM | POA: Diagnosis not present

## 2015-08-13 DIAGNOSIS — R0789 Other chest pain: Secondary | ICD-10-CM | POA: Diagnosis not present

## 2015-08-13 DIAGNOSIS — Z87448 Personal history of other diseases of urinary system: Secondary | ICD-10-CM | POA: Insufficient documentation

## 2015-08-13 LAB — CBC
HCT: 38.7 % (ref 36.0–46.0)
Hemoglobin: 12.2 g/dL (ref 12.0–15.0)
MCH: 28 pg (ref 26.0–34.0)
MCHC: 31.5 g/dL (ref 30.0–36.0)
MCV: 88.8 fL (ref 78.0–100.0)
Platelets: 267 10*3/uL (ref 150–400)
RBC: 4.36 MIL/uL (ref 3.87–5.11)
RDW: 14.8 % (ref 11.5–15.5)
WBC: 6.5 10*3/uL (ref 4.0–10.5)

## 2015-08-13 LAB — COMPREHENSIVE METABOLIC PANEL
ALT: 17 U/L (ref 14–54)
AST: 22 U/L (ref 15–41)
Albumin: 3.8 g/dL (ref 3.5–5.0)
Alkaline Phosphatase: 49 U/L (ref 38–126)
Anion gap: 7 (ref 5–15)
BUN: 20 mg/dL (ref 6–20)
CO2: 28 mmol/L (ref 22–32)
Calcium: 9.1 mg/dL (ref 8.9–10.3)
Chloride: 104 mmol/L (ref 101–111)
Creatinine, Ser: 0.6 mg/dL (ref 0.44–1.00)
GFR calc Af Amer: >60 mL/min (ref >60)
GFR calc non Af Amer: >60 mL/min (ref >60)
Glucose, Bld: 102 mg/dL — ABNORMAL HIGH (ref 65–99)
Potassium: 4.2 mmol/L (ref 3.5–5.1)
Sodium: 139 mmol/L (ref 135–145)
Total Bilirubin: 0.5 mg/dL (ref 0.3–1.2)
Total Protein: 6.7 g/dL (ref 6.5–8.1)

## 2015-08-13 LAB — LIPASE, BLOOD: Lipase: 38 U/L (ref 11–51)

## 2015-08-13 LAB — TROPONIN I: Troponin I: <0.03 ng/mL (ref <0.031)

## 2015-08-13 MED ORDER — IOHEXOL 350 MG/ML SOLN
100.0000 mL | Freq: Once | INTRAVENOUS | Status: AC | PRN
  Administered 2015-08-13: 100 mL via INTRAVENOUS

## 2015-08-13 NOTE — ED Notes (Signed)
Patient transported to X-ray 

## 2015-08-13 NOTE — Discharge Instructions (Signed)
Nonspecific Chest Pain  °Chest pain can be caused by many different conditions. There is always a chance that your pain could be related to something serious, such as a heart attack or a blood clot in your lungs. Chest pain can also be caused by conditions that are not life-threatening. If you have chest pain, it is very important to follow up with your health care provider. °CAUSES  °Chest pain can be caused by: °· Heartburn. °· Pneumonia or bronchitis. °· Anxiety or stress. °· Inflammation around your heart (pericarditis) or lung (pleuritis or pleurisy). °· A blood clot in your lung. °· A collapsed lung (pneumothorax). It can develop suddenly on its own (spontaneous pneumothorax) or from trauma to the chest. °· Shingles infection (varicella-zoster virus). °· Heart attack. °· Damage to the bones, muscles, and cartilage that make up your chest wall. This can include: °¨ Bruised bones due to injury. °¨ Strained muscles or cartilage due to frequent or repeated coughing or overwork. °¨ Fracture to one or more ribs. °¨ Sore cartilage due to inflammation (costochondritis). °RISK FACTORS  °Risk factors for chest pain may include: °· Activities that increase your risk for trauma or injury to your chest. °· Respiratory infections or conditions that cause frequent coughing. °· Medical conditions or overeating that can cause heartburn. °· Heart disease or family history of heart disease. °· Conditions or health behaviors that increase your risk of developing a blood clot. °· Having had chicken pox (varicella zoster). °SIGNS AND SYMPTOMS °Chest pain can feel like: °· Burning or tingling on the surface of your chest or deep in your chest. °· Crushing, pressure, aching, or squeezing pain. °· Dull or sharp pain that is worse when you move, cough, or take a deep breath. °· Pain that is also felt in your back, neck, shoulder, or arm, or pain that spreads to any of these areas. °Your chest pain may come and go, or it may stay  constant. °DIAGNOSIS °Lab tests or other studies may be needed to find the cause of your pain. Your health care provider may have you take a test called an ambulatory ECG (electrocardiogram). An ECG records your heartbeat patterns at the time the test is performed. You may also have other tests, such as: °· Transthoracic echocardiogram (TTE). During echocardiography, sound waves are used to create a picture of all of the heart structures and to look at how blood flows through your heart. °· Transesophageal echocardiogram (TEE). This is a more advanced imaging test that obtains images from inside your body. It allows your health care provider to see your heart in finer detail. °· Cardiac monitoring. This allows your health care provider to monitor your heart rate and rhythm in real time. °· Holter monitor. This is a portable device that records your heartbeat and can help to diagnose abnormal heartbeats. It allows your health care provider to track your heart activity for several days, if needed. °· Stress tests. These can be done through exercise or by taking medicine that makes your heart beat more quickly. °· Blood tests. °· Imaging tests. °TREATMENT  °Your treatment depends on what is causing your chest pain. Treatment may include: °· Medicines. These may include: °¨ Acid blockers for heartburn. °¨ Anti-inflammatory medicine. °¨ Pain medicine for inflammatory conditions. °¨ Antibiotic medicine, if an infection is present. °¨ Medicines to dissolve blood clots. °¨ Medicines to treat coronary artery disease. °· Supportive care for conditions that do not require medicines. This may include: °¨ Resting. °¨ Applying heat   or cold packs to injured areas. °¨ Limiting activities until pain decreases. °HOME CARE INSTRUCTIONS °· If you were prescribed an antibiotic medicine, finish it all even if you start to feel better. °· Avoid any activities that bring on chest pain. °· Do not use any tobacco products, including  cigarettes, chewing tobacco, or electronic cigarettes. If you need help quitting, ask your health care provider. °· Do not drink alcohol. °· Take medicines only as directed by your health care provider. °· Keep all follow-up visits as directed by your health care provider. This is important. This includes any further testing if your chest pain does not go away. °· If heartburn is the cause for your chest pain, you may be told to keep your head raised (elevated) while sleeping. This reduces the chance that acid will go from your stomach into your esophagus. °· Make lifestyle changes as directed by your health care provider. These may include: °¨ Getting regular exercise. Ask your health care provider to suggest some activities that are safe for you. °¨ Eating a heart-healthy diet. A registered dietitian can help you to learn healthy eating options. °¨ Maintaining a healthy weight. °¨ Managing diabetes, if necessary. °¨ Reducing stress. °SEEK MEDICAL CARE IF: °· Your chest pain does not go away after treatment. °· You have a rash with blisters on your chest. °· You have a fever. °SEEK IMMEDIATE MEDICAL CARE IF:  °· Your chest pain is worse. °· You have an increasing cough, or you cough up blood. °· You have severe abdominal pain. °· You have severe weakness. °· You faint. °· You have chills. °· You have sudden, unexplained chest discomfort. °· You have sudden, unexplained discomfort in your arms, back, neck, or jaw. °· You have shortness of breath at any time. °· You suddenly start to sweat, or your skin gets clammy. °· You feel nauseous or you vomit. °· You suddenly feel light-headed or dizzy. °· Your heart begins to beat quickly, or it feels like it is skipping beats. °These symptoms may represent a serious problem that is an emergency. Do not wait to see if the symptoms will go away. Get medical help right away. Call your local emergency services (911 in the [place] Do not drive yourself to the hospital. °  °This  information is not intended to replace advice given to you by your health care provider. Make sure you discuss any questions you have with your health care provider. °  °Document Released: 04/30/2005 Document Revised: 08/11/2014 Document Reviewed: 02/24/2014 °Elsevier Interactive Patient Education ©2016 [employer] ° °

## 2015-08-13 NOTE — ED Provider Notes (Signed)
CSN: [id number]     Arrival date & time 08/13/15  1236 History   First MD Initiated Contact with Patient 08/13/15 1250     Chief Complaint  Patient presents with  . Abdominal Pain     HPI Patient presents to the emergency department with complaints of bilateral sharp chest discomfort.  She reports it feels similar to her prior diagnosis of pulmonary embolism.  She is diagnosed with a pulmonary embolus in several years ago was treated with 6 months of anticoagulation after a flight back from [place].  She just flew back from [place] and had a 4 hour flight.  She states this discomfort in her chest feels similar.  She denies shortness of breath.  There is no pleuritic component to her pain.  She denies abdominal pain.   No cough or congestion.  Denies fevers and chills.  Symptoms are mild in severity.  She's concerned about the possibility of a recurrent PE.   Past Medical History  Diagnosis Date  . GERD (gastroesophageal reflux disease)   . Swelling     FEET AND LEGS B/L  . Overactive bladder   . History of chicken pox   . Mumps   . Weakness of left leg   . PE (pulmonary embolism)     2012  . Elevated blood pressure (not hypertension)   . AAA (abdominal aortic aneurysm) [facility])    Past Surgical History  Procedure Laterality Date  . Foot surgery Bilateral     BUNION  . Foot surgery Right     TENDON  . Knee surgery Right    Family History  Problem Relation Age of Onset  . Arthritis Father   . Colon cancer Mother   . Heart disease Father   . Heart disease Mother   . Mental illness Mother   . Allergies Mother   . Allergies Father   . Multiple sclerosis Brother   . Prostate cancer Brother   . Stroke Brother   . Healthy Son     x1  . Healthy Daughter     x1   Social History  Substance Use Topics  . Smoking status: Former Smoker    Types: Cigarettes    Quit date: 05/11/1961  . Smokeless tobacco: Never Used  . Alcohol Use: 0.0 oz/week    0 Standard drinks or equivalent  per week     Comment: WINE 3 TIMES WEEKLY   OB History    No data available     Review of Systems  All other systems reviewed and are negative.     Allergies  Ao-disc catalyst and Sulfa drugs cross reactors  Home Medications   Prior to Admission medications   Medication Sig Start Date End Date Taking? Authorizing Provider  acetaminophen (TYLENOL) 650 MG CR tablet Take 650 mg by mouth every 8 (eight) hours as needed for pain.    Historical Provider, MD  aspirin 81 MG tablet Take 81 mg by mouth daily.    Historical Provider, MD  Calcium Carbonate-Vitamin D (CALTRATE 600+D) 600-400 MG-UNIT per tablet Take 2 tablets by mouth daily.     Historical Provider, MD  cholecalciferol (VITAMIN D) 1000 UNITS tablet Take 1,000 Units by mouth daily.      Historical Provider, MD  fish oil-omega-3 fatty acids 1000 MG capsule Take 1 g by mouth daily.      Historical Provider, MD  fluticasone (FLONASE) 50 MCG/ACT nasal spray daily as needed.  07/12/13   Historical Provider, MD  magnesium oxide (MAG-OX) 400 MG tablet Take 400 mg by mouth daily.      Historical Provider, MD  Multiple Vitamin (MULTIVITAMIN) tablet Take 1 tablet by mouth daily.      Historical Provider, MD  oxybutynin (DITROPAN-XL) 5 MG 24 hr tablet Take 2.5 mg by mouth daily.     Historical Provider, MD  pantoprazole (PROTONIX) 40 MG tablet Take 20 mg by mouth daily.  07/12/13   Historical Provider, MD  Potassium 99 MG TABS Take 1 tablet by mouth daily.      Historical Provider, MD  pyridoxine (B-6) 100 MG tablet Take 100 mg by mouth daily.      Historical Provider, MD  vitamin B-12 (CYANOCOBALAMIN) 1000 MCG tablet Take 1,000 mcg by mouth daily.      Historical Provider, MD  vitamin E 400 UNIT capsule Take 400 Units by mouth daily.      Historical Provider, MD   BP 170/82 mmHg  Pulse 64  Temp(Src) 97.6 F (36.4 C) (Oral)  Resp 16  Ht 5\' 4"  (1.626 m)  Wt 170 lb (77.111 kg)  BMI 29.17 kg/m2  SpO2 99% Physical Exam  Constitutional:  She is oriented to person, place, and time. She appears well-developed and well-nourished. No distress.  HENT:  Head: Normocephalic and atraumatic.  Eyes: EOM are normal.  Neck: Normal range of motion.  Cardiovascular: Normal rate, regular rhythm and normal heart sounds.   Pulmonary/Chest: Effort normal and breath sounds normal.  Abdominal: Soft. She exhibits no distension. There is no tenderness.  Musculoskeletal: Normal range of motion.  Neurological: She is alert and oriented to person, place, and time.  Skin: Skin is warm and dry.  Psychiatric: She has a normal mood and affect. Judgment normal.  Nursing note and vitals reviewed.   ED Course  Procedures (including critical care time) Labs Review Labs Reviewed  COMPREHENSIVE METABOLIC PANEL - Abnormal; Notable for the following:    Glucose, Bld 102 (*)    All other components within normal limits  CBC  TROPONIN I  LIPASE, BLOOD    Imaging Review Dg Chest 2 View  08/13/2015  CLINICAL DATA:  Abdominal pain, chest pain EXAM: CHEST  2 VIEW COMPARISON:  05/16/2011 FINDINGS: Cardiomediastinal silhouette is stable. No acute infiltrate or pleural effusion. No pulmonary edema. Degenerative changes mid and lower thoracic spine. Stable bilateral basilar atelectasis or scarring. IMPRESSION: No active disease. Degenerative changes mid and lower thoracic spine. Stable bilateral basilar atelectasis or scarring. Electronically Signed   By: Natasha MeadLiviu  Pop M.D.   On: 08/13/2015 13:34   Ct Angio Chest Pe W/cm &/or Wo Cm  08/13/2015  CLINICAL DATA:  , Hx of PE. Acute onset left chest pain after recent flight. No longer on anticoagulation,pt also presented with acute upper abd pain, GERD, hx pe, hx AAA EXAM: CT ANGIOGRAPHY CHEST WITH CONTRAST TECHNIQUE: Multidetector CT imaging of the chest was performed using the standard protocol during bolus administration of intravenous contrast. Multiplanar CT image reconstructions and MIPs were obtained to evaluate the  vascular anatomy. CONTRAST:  100mL OMNIPAQUE IOHEXOL 350 MG/ML SOLN COMPARISON:  05/16/2011 FINDINGS: Right arm IV contrast injection. The SVC is patent. RV/LV ratio less than 1. Satisfactory opacification of pulmonary arteries noted, and there is no evidence of pulmonary emboli. Patent pulmonary veins. Adequate contrast opacification of the thoracic aorta with no evidence of dissection, aneurysm, or stenosis. There is classic 3-vessel brachiocephalic arch anatomy without proximal stenosis. Scattered aortic arch and descending aortic calcifications. No pleural or  pericardial effusion. No hilar or mediastinal adenopathy. Dependent atelectasis posteriorly in the lower lobes. No nodule or confluent airspace consolidation. Spondylitic changes in the lower thoracic spine.  Sternum intact. Review of the MIP images confirms the above findings. IMPRESSION: 1. Negative for acute PE or thoracic aortic dissection. Electronically Signed   By: Corlis Leak M.D.   On: 08/13/2015 14:39   I have personally reviewed and evaluated these images and lab results as part of my medical decision-making.   EKG Interpretation   Date/Time:  Monday August 13 2015 13:25:26 EST Ventricular Rate:  68 PR Interval:  144 QRS Duration: 76 QT Interval:  398 QTC Calculation: 423 R Axis:   50 Text Interpretation:  Normal sinus rhythm Normal ECG No old tracing to  compare Confirmed by Courtland Coppa  MD, Caryn Bee ([id number]) on 08/13/2015 2:02:26 PM      MDM   Final diagnoses:  History of pulmonary embolism  Chest pain    Patient feels much better this time.  CT scan of her chest is without PE.  Repeat abdominal exam is benign.  No abdominal pain or flank pain.  Vital signs are normal.  Doubt ACS.  Troponins negative.  EKG is without ischemic changes.  At this time she is without significant complaints.  Primary care follow-up.  She understands to return to the ER for new or worsening symptoms    Azalia Bilis, MD 08/13/15 1456

## 2015-08-13 NOTE — Telephone Encounter (Signed)
Patient called stated she has been having chest pain and tightness in her chest for 3 days and has had a pulmonary embolism in the past and it feels that same way wants to see if we can run a test to see if this is so. I informed patient per protocol I have to transfer her to team health for further evaluation. Ms Dominique Cruz was transferred to team health,

## 2015-08-13 NOTE — ED Notes (Signed)
C/o abd pain x 3-4 days-concerned due to hx of AAA-NAD-steady gait

## 2015-08-13 NOTE — Telephone Encounter (Signed)
PLEASE NOTE: All timestamps contained within this report are represented as [place] Standard Time. CONFIDENTIALTY NOTICE: This fax transmission is intended only for the addressee. It contains information that is legally privileged, confidential or otherwise protected from use or disclosure. If you are not the intended recipient, you are strictly prohibited from reviewing, disclosing, copying using or disseminating any of this information or taking any action in reliance on or regarding this information. If you have received this fax in error, please notify us immediately by telephone so that we can arrange for its return to us. Phone: [phone], Toll-Free: [phone], Fax: [phone] Page: 1 of 1 Call Id: [id number] [facility] Day - Client TELEPHONE ADVICE RECORD [facility] Patient Name: Dominique Cruz DOB: 04/20/1941 Initial Comment Caller States she has tightness in chest come and goes. Nurse Assessment Nurse: Elijah Birkaldwell, RN, Lynda Date/Time (Eastern Time): 08/13/2015 12:07:37 PM Confirm and document reason for call. If symptomatic, describe symptoms. ---Caller states she has tightness in chest come and goes in the middle under the bra strap, to the right as well. The episodes feel like an ache, >5 mins. Feels worse with bra on. This has been going on for a couple days. Had a PE about 3 years ago. No SOB. Has the patient traveled out of the country within the last 30 days? ---Not Applicable Does the patient have any new or worsening symptoms? ---Yes Will a triage be completed? ---Yes Related visit to physician within the last 2 weeks? ---No Does the PT have any chronic conditions? (i.e. diabetes, asthma, etc.) ---Yes List chronic conditions. ---GERD Is this a behavioral health or substance abuse call? ---No Guidelines Guideline Title Affirmed Question Affirmed Notes Chest Pain [1] Chest pain lasts > 5 minutes AND [2] age > 1350 Final  Disposition User Call EMS 911 Now Elijah Birkaldwell, RN, Stark BrayLynda Comments Caller states she is [place] mile from the hospital on [address]. Her husband can take her there. She has had the chest pain for over an hour this morning. Was on blood thinners for 6 months when she had the PE with similar symptoms. Referrals [facility] - ED Disagree/Comply: Comply

## 2015-08-13 NOTE — Telephone Encounter (Signed)
She needs to go to the ER if she is having chest pain and tightness and feels it is similar to prior clot. They will be able to get testing performed a lot quicker that I can and if I had concern for a clot in office, she would be sent to ER anyways for emergent testing.

## 2015-10-09 DIAGNOSIS — M791 Myalgia: Secondary | ICD-10-CM | POA: Diagnosis not present

## 2015-10-09 DIAGNOSIS — M4726 Other spondylosis with radiculopathy, lumbar region: Secondary | ICD-10-CM | POA: Diagnosis not present

## 2015-10-09 DIAGNOSIS — M5127 Other intervertebral disc displacement, lumbosacral region: Secondary | ICD-10-CM | POA: Diagnosis not present

## 2015-10-09 DIAGNOSIS — M461 Sacroiliitis, not elsewhere classified: Secondary | ICD-10-CM | POA: Diagnosis not present

## 2015-10-09 DIAGNOSIS — M542 Cervicalgia: Secondary | ICD-10-CM | POA: Diagnosis not present

## 2015-10-09 DIAGNOSIS — M25551 Pain in right hip: Secondary | ICD-10-CM | POA: Diagnosis not present

## 2015-10-09 DIAGNOSIS — M545 Low back pain: Secondary | ICD-10-CM | POA: Diagnosis not present

## 2015-10-17 DIAGNOSIS — M545 Low back pain: Secondary | ICD-10-CM | POA: Diagnosis not present

## 2015-10-17 DIAGNOSIS — M461 Sacroiliitis, not elsewhere classified: Secondary | ICD-10-CM | POA: Diagnosis not present

## 2015-10-17 DIAGNOSIS — M4726 Other spondylosis with radiculopathy, lumbar region: Secondary | ICD-10-CM | POA: Diagnosis not present

## 2015-10-17 DIAGNOSIS — M5127 Other intervertebral disc displacement, lumbosacral region: Secondary | ICD-10-CM | POA: Diagnosis not present

## 2015-10-17 DIAGNOSIS — M791 Myalgia: Secondary | ICD-10-CM | POA: Diagnosis not present

## 2015-10-17 DIAGNOSIS — M542 Cervicalgia: Secondary | ICD-10-CM | POA: Diagnosis not present

## 2015-10-22 DIAGNOSIS — M5417 Radiculopathy, lumbosacral region: Secondary | ICD-10-CM | POA: Diagnosis not present

## 2015-10-22 DIAGNOSIS — M5137 Other intervertebral disc degeneration, lumbosacral region: Secondary | ICD-10-CM | POA: Diagnosis not present

## 2015-10-24 DIAGNOSIS — M461 Sacroiliitis, not elsewhere classified: Secondary | ICD-10-CM | POA: Diagnosis not present

## 2015-10-24 DIAGNOSIS — M791 Myalgia: Secondary | ICD-10-CM | POA: Diagnosis not present

## 2015-10-24 DIAGNOSIS — M5127 Other intervertebral disc displacement, lumbosacral region: Secondary | ICD-10-CM | POA: Diagnosis not present

## 2015-10-24 DIAGNOSIS — M4726 Other spondylosis with radiculopathy, lumbar region: Secondary | ICD-10-CM | POA: Diagnosis not present

## 2015-10-30 DIAGNOSIS — M545 Low back pain: Secondary | ICD-10-CM | POA: Diagnosis not present

## 2015-10-30 DIAGNOSIS — M791 Myalgia: Secondary | ICD-10-CM | POA: Diagnosis not present

## 2015-10-30 DIAGNOSIS — M5127 Other intervertebral disc displacement, lumbosacral region: Secondary | ICD-10-CM | POA: Diagnosis not present

## 2015-10-30 DIAGNOSIS — M461 Sacroiliitis, not elsewhere classified: Secondary | ICD-10-CM | POA: Diagnosis not present

## 2015-10-30 DIAGNOSIS — M4726 Other spondylosis with radiculopathy, lumbar region: Secondary | ICD-10-CM | POA: Diagnosis not present

## 2015-11-01 DIAGNOSIS — M4726 Other spondylosis with radiculopathy, lumbar region: Secondary | ICD-10-CM | POA: Diagnosis not present

## 2015-11-01 DIAGNOSIS — M5127 Other intervertebral disc displacement, lumbosacral region: Secondary | ICD-10-CM | POA: Diagnosis not present

## 2015-11-01 DIAGNOSIS — M461 Sacroiliitis, not elsewhere classified: Secondary | ICD-10-CM | POA: Diagnosis not present

## 2015-11-01 DIAGNOSIS — M791 Myalgia: Secondary | ICD-10-CM | POA: Diagnosis not present

## 2015-11-05 DIAGNOSIS — M545 Low back pain: Secondary | ICD-10-CM | POA: Diagnosis not present

## 2015-11-05 DIAGNOSIS — M4726 Other spondylosis with radiculopathy, lumbar region: Secondary | ICD-10-CM | POA: Diagnosis not present

## 2015-11-05 DIAGNOSIS — M461 Sacroiliitis, not elsewhere classified: Secondary | ICD-10-CM | POA: Diagnosis not present

## 2015-11-05 DIAGNOSIS — M791 Myalgia: Secondary | ICD-10-CM | POA: Diagnosis not present

## 2015-11-05 DIAGNOSIS — M5127 Other intervertebral disc displacement, lumbosacral region: Secondary | ICD-10-CM | POA: Diagnosis not present

## 2015-11-07 DIAGNOSIS — M4726 Other spondylosis with radiculopathy, lumbar region: Secondary | ICD-10-CM | POA: Diagnosis not present

## 2015-11-07 DIAGNOSIS — M791 Myalgia: Secondary | ICD-10-CM | POA: Diagnosis not present

## 2015-11-07 DIAGNOSIS — M5127 Other intervertebral disc displacement, lumbosacral region: Secondary | ICD-10-CM | POA: Diagnosis not present

## 2015-11-07 DIAGNOSIS — M461 Sacroiliitis, not elsewhere classified: Secondary | ICD-10-CM | POA: Diagnosis not present

## 2015-11-07 DIAGNOSIS — M545 Low back pain: Secondary | ICD-10-CM | POA: Diagnosis not present

## 2015-11-12 DIAGNOSIS — M4726 Other spondylosis with radiculopathy, lumbar region: Secondary | ICD-10-CM | POA: Diagnosis not present

## 2015-11-12 DIAGNOSIS — M5127 Other intervertebral disc displacement, lumbosacral region: Secondary | ICD-10-CM | POA: Diagnosis not present

## 2015-11-12 DIAGNOSIS — M791 Myalgia: Secondary | ICD-10-CM | POA: Diagnosis not present

## 2015-11-12 DIAGNOSIS — M461 Sacroiliitis, not elsewhere classified: Secondary | ICD-10-CM | POA: Diagnosis not present

## 2015-11-14 ENCOUNTER — Other Ambulatory Visit: Payer: Self-pay | Admitting: Internal Medicine

## 2015-11-14 DIAGNOSIS — M4726 Other spondylosis with radiculopathy, lumbar region: Secondary | ICD-10-CM | POA: Diagnosis not present

## 2015-11-14 DIAGNOSIS — M5116 Intervertebral disc disorders with radiculopathy, lumbar region: Secondary | ICD-10-CM | POA: Diagnosis not present

## 2015-11-14 DIAGNOSIS — M545 Low back pain: Secondary | ICD-10-CM | POA: Diagnosis not present

## 2015-11-14 DIAGNOSIS — G629 Polyneuropathy, unspecified: Secondary | ICD-10-CM | POA: Diagnosis not present

## 2015-11-14 DIAGNOSIS — M47816 Spondylosis without myelopathy or radiculopathy, lumbar region: Secondary | ICD-10-CM

## 2015-11-14 DIAGNOSIS — M5127 Other intervertebral disc displacement, lumbosacral region: Secondary | ICD-10-CM | POA: Diagnosis not present

## 2015-11-14 DIAGNOSIS — M461 Sacroiliitis, not elsewhere classified: Secondary | ICD-10-CM | POA: Diagnosis not present

## 2015-11-14 DIAGNOSIS — M791 Myalgia: Secondary | ICD-10-CM | POA: Diagnosis not present

## 2015-11-19 DIAGNOSIS — M461 Sacroiliitis, not elsewhere classified: Secondary | ICD-10-CM | POA: Diagnosis not present

## 2015-11-19 DIAGNOSIS — M5127 Other intervertebral disc displacement, lumbosacral region: Secondary | ICD-10-CM | POA: Diagnosis not present

## 2015-11-19 DIAGNOSIS — M4726 Other spondylosis with radiculopathy, lumbar region: Secondary | ICD-10-CM | POA: Diagnosis not present

## 2015-11-19 DIAGNOSIS — M791 Myalgia: Secondary | ICD-10-CM | POA: Diagnosis not present

## 2015-11-21 DIAGNOSIS — M461 Sacroiliitis, not elsewhere classified: Secondary | ICD-10-CM | POA: Diagnosis not present

## 2015-11-21 DIAGNOSIS — M5127 Other intervertebral disc displacement, lumbosacral region: Secondary | ICD-10-CM | POA: Diagnosis not present

## 2015-11-21 DIAGNOSIS — M791 Myalgia: Secondary | ICD-10-CM | POA: Diagnosis not present

## 2015-11-21 DIAGNOSIS — M545 Low back pain: Secondary | ICD-10-CM | POA: Diagnosis not present

## 2015-11-21 DIAGNOSIS — M4726 Other spondylosis with radiculopathy, lumbar region: Secondary | ICD-10-CM | POA: Diagnosis not present

## 2015-11-22 ENCOUNTER — Ambulatory Visit
Admission: RE | Admit: 2015-11-22 | Discharge: 2015-11-22 | Disposition: A | Discharge status: Home / Self Care | Payer: Medicare Other | Source: Ambulatory Visit | Attending: Internal Medicine | Admitting: Internal Medicine

## 2015-11-22 DIAGNOSIS — M5126 Other intervertebral disc displacement, lumbar region: Secondary | ICD-10-CM | POA: Diagnosis not present

## 2015-11-22 DIAGNOSIS — M47816 Spondylosis without myelopathy or radiculopathy, lumbar region: Secondary | ICD-10-CM

## 2015-11-22 DIAGNOSIS — M4806 Spinal stenosis, lumbar region: Secondary | ICD-10-CM | POA: Diagnosis not present

## 2015-11-23 DIAGNOSIS — H35033 Hypertensive retinopathy, bilateral: Secondary | ICD-10-CM | POA: Diagnosis not present

## 2015-11-23 DIAGNOSIS — H524 Presbyopia: Secondary | ICD-10-CM | POA: Diagnosis not present

## 2015-11-23 DIAGNOSIS — H26493 Other secondary cataract, bilateral: Secondary | ICD-10-CM | POA: Diagnosis not present

## 2015-11-26 DIAGNOSIS — M461 Sacroiliitis, not elsewhere classified: Secondary | ICD-10-CM | POA: Diagnosis not present

## 2015-11-26 DIAGNOSIS — M4726 Other spondylosis with radiculopathy, lumbar region: Secondary | ICD-10-CM | POA: Diagnosis not present

## 2015-11-26 DIAGNOSIS — M5127 Other intervertebral disc displacement, lumbosacral region: Secondary | ICD-10-CM | POA: Diagnosis not present

## 2015-11-26 DIAGNOSIS — M791 Myalgia: Secondary | ICD-10-CM | POA: Diagnosis not present

## 2015-11-26 DIAGNOSIS — M545 Low back pain: Secondary | ICD-10-CM | POA: Diagnosis not present

## 2015-11-28 DIAGNOSIS — M5137 Other intervertebral disc degeneration, lumbosacral region: Secondary | ICD-10-CM | POA: Diagnosis not present

## 2015-11-28 DIAGNOSIS — M791 Myalgia: Secondary | ICD-10-CM | POA: Diagnosis not present

## 2015-11-28 DIAGNOSIS — M461 Sacroiliitis, not elsewhere classified: Secondary | ICD-10-CM | POA: Diagnosis not present

## 2015-11-28 DIAGNOSIS — M5116 Intervertebral disc disorders with radiculopathy, lumbar region: Secondary | ICD-10-CM | POA: Diagnosis not present

## 2015-11-28 DIAGNOSIS — M5127 Other intervertebral disc displacement, lumbosacral region: Secondary | ICD-10-CM | POA: Diagnosis not present

## 2015-12-03 DIAGNOSIS — M461 Sacroiliitis, not elsewhere classified: Secondary | ICD-10-CM | POA: Diagnosis not present

## 2015-12-03 DIAGNOSIS — M791 Myalgia: Secondary | ICD-10-CM | POA: Diagnosis not present

## 2015-12-03 DIAGNOSIS — M4806 Spinal stenosis, lumbar region: Secondary | ICD-10-CM | POA: Diagnosis not present

## 2015-12-03 DIAGNOSIS — M5137 Other intervertebral disc degeneration, lumbosacral region: Secondary | ICD-10-CM | POA: Diagnosis not present

## 2015-12-03 DIAGNOSIS — M5127 Other intervertebral disc displacement, lumbosacral region: Secondary | ICD-10-CM | POA: Diagnosis not present

## 2015-12-03 DIAGNOSIS — M545 Low back pain: Secondary | ICD-10-CM | POA: Diagnosis not present

## 2015-12-03 MED FILL — NABUMETONE 500 MG TABLET: 500 | 30 days supply | Qty: 60 | Fill #0

## 2015-12-05 DIAGNOSIS — I10 Essential (primary) hypertension: Secondary | ICD-10-CM | POA: Diagnosis not present

## 2015-12-05 MED FILL — VALSARTAN 40 MG TABLET: 40 | 30 days supply | Qty: 30 | Fill #0

## 2015-12-10 DIAGNOSIS — M5137 Other intervertebral disc degeneration, lumbosacral region: Secondary | ICD-10-CM | POA: Diagnosis not present

## 2015-12-10 DIAGNOSIS — M791 Myalgia: Secondary | ICD-10-CM | POA: Diagnosis not present

## 2015-12-10 DIAGNOSIS — M461 Sacroiliitis, not elsewhere classified: Secondary | ICD-10-CM | POA: Diagnosis not present

## 2015-12-10 DIAGNOSIS — M5127 Other intervertebral disc displacement, lumbosacral region: Secondary | ICD-10-CM | POA: Diagnosis not present

## 2016-01-04 MED FILL — VALSARTAN 40 MG TABLET: 40 | 30 days supply | Qty: 30 | Fill #1

## 2016-01-07 DIAGNOSIS — M5127 Other intervertebral disc displacement, lumbosacral region: Secondary | ICD-10-CM | POA: Diagnosis not present

## 2016-01-07 DIAGNOSIS — M461 Sacroiliitis, not elsewhere classified: Secondary | ICD-10-CM | POA: Diagnosis not present

## 2016-01-07 DIAGNOSIS — M5137 Other intervertebral disc degeneration, lumbosacral region: Secondary | ICD-10-CM | POA: Diagnosis not present

## 2016-01-07 DIAGNOSIS — M791 Myalgia: Secondary | ICD-10-CM | POA: Diagnosis not present

## 2016-01-21 DIAGNOSIS — M5137 Other intervertebral disc degeneration, lumbosacral region: Secondary | ICD-10-CM | POA: Diagnosis not present

## 2016-01-21 DIAGNOSIS — M461 Sacroiliitis, not elsewhere classified: Secondary | ICD-10-CM | POA: Diagnosis not present

## 2016-01-21 DIAGNOSIS — M791 Myalgia: Secondary | ICD-10-CM | POA: Diagnosis not present

## 2016-01-21 DIAGNOSIS — M5127 Other intervertebral disc displacement, lumbosacral region: Secondary | ICD-10-CM | POA: Diagnosis not present

## 2016-01-23 DIAGNOSIS — M5127 Other intervertebral disc displacement, lumbosacral region: Secondary | ICD-10-CM | POA: Diagnosis not present

## 2016-01-23 DIAGNOSIS — M5137 Other intervertebral disc degeneration, lumbosacral region: Secondary | ICD-10-CM | POA: Diagnosis not present

## 2016-01-23 DIAGNOSIS — M791 Myalgia: Secondary | ICD-10-CM | POA: Diagnosis not present

## 2016-01-23 DIAGNOSIS — M461 Sacroiliitis, not elsewhere classified: Secondary | ICD-10-CM | POA: Diagnosis not present

## 2016-02-06 DIAGNOSIS — M461 Sacroiliitis, not elsewhere classified: Secondary | ICD-10-CM | POA: Diagnosis not present

## 2016-02-06 DIAGNOSIS — M5127 Other intervertebral disc displacement, lumbosacral region: Secondary | ICD-10-CM | POA: Diagnosis not present

## 2016-02-06 DIAGNOSIS — M5137 Other intervertebral disc degeneration, lumbosacral region: Secondary | ICD-10-CM | POA: Diagnosis not present

## 2016-02-06 DIAGNOSIS — M791 Myalgia: Secondary | ICD-10-CM | POA: Diagnosis not present

## 2016-02-08 DIAGNOSIS — M461 Sacroiliitis, not elsewhere classified: Secondary | ICD-10-CM | POA: Diagnosis not present

## 2016-02-08 DIAGNOSIS — M5136 Other intervertebral disc degeneration, lumbar region: Secondary | ICD-10-CM | POA: Diagnosis not present

## 2016-02-08 DIAGNOSIS — M5126 Other intervertebral disc displacement, lumbar region: Secondary | ICD-10-CM | POA: Diagnosis not present

## 2016-02-08 DIAGNOSIS — M791 Myalgia: Secondary | ICD-10-CM | POA: Diagnosis not present

## 2016-02-11 DIAGNOSIS — M5136 Other intervertebral disc degeneration, lumbar region: Secondary | ICD-10-CM | POA: Diagnosis not present

## 2016-02-11 DIAGNOSIS — M461 Sacroiliitis, not elsewhere classified: Secondary | ICD-10-CM | POA: Diagnosis not present

## 2016-02-11 DIAGNOSIS — M5126 Other intervertebral disc displacement, lumbar region: Secondary | ICD-10-CM | POA: Diagnosis not present

## 2016-02-11 DIAGNOSIS — M791 Myalgia: Secondary | ICD-10-CM | POA: Diagnosis not present

## 2016-02-18 DIAGNOSIS — M5136 Other intervertebral disc degeneration, lumbar region: Secondary | ICD-10-CM | POA: Diagnosis not present

## 2016-02-18 DIAGNOSIS — M5126 Other intervertebral disc displacement, lumbar region: Secondary | ICD-10-CM | POA: Diagnosis not present

## 2016-02-18 DIAGNOSIS — M461 Sacroiliitis, not elsewhere classified: Secondary | ICD-10-CM | POA: Diagnosis not present

## 2016-02-18 DIAGNOSIS — M791 Myalgia: Secondary | ICD-10-CM | POA: Diagnosis not present

## 2016-03-19 IMAGING — DX DG CHEST 2V
2 series · 2 of 2 positions shown · non-contrast
Comparison: 05/16/2011

CLINICAL DATA: Abdominal pain, chest pain

EXAM:
CHEST  2 VIEW

[chest pa]
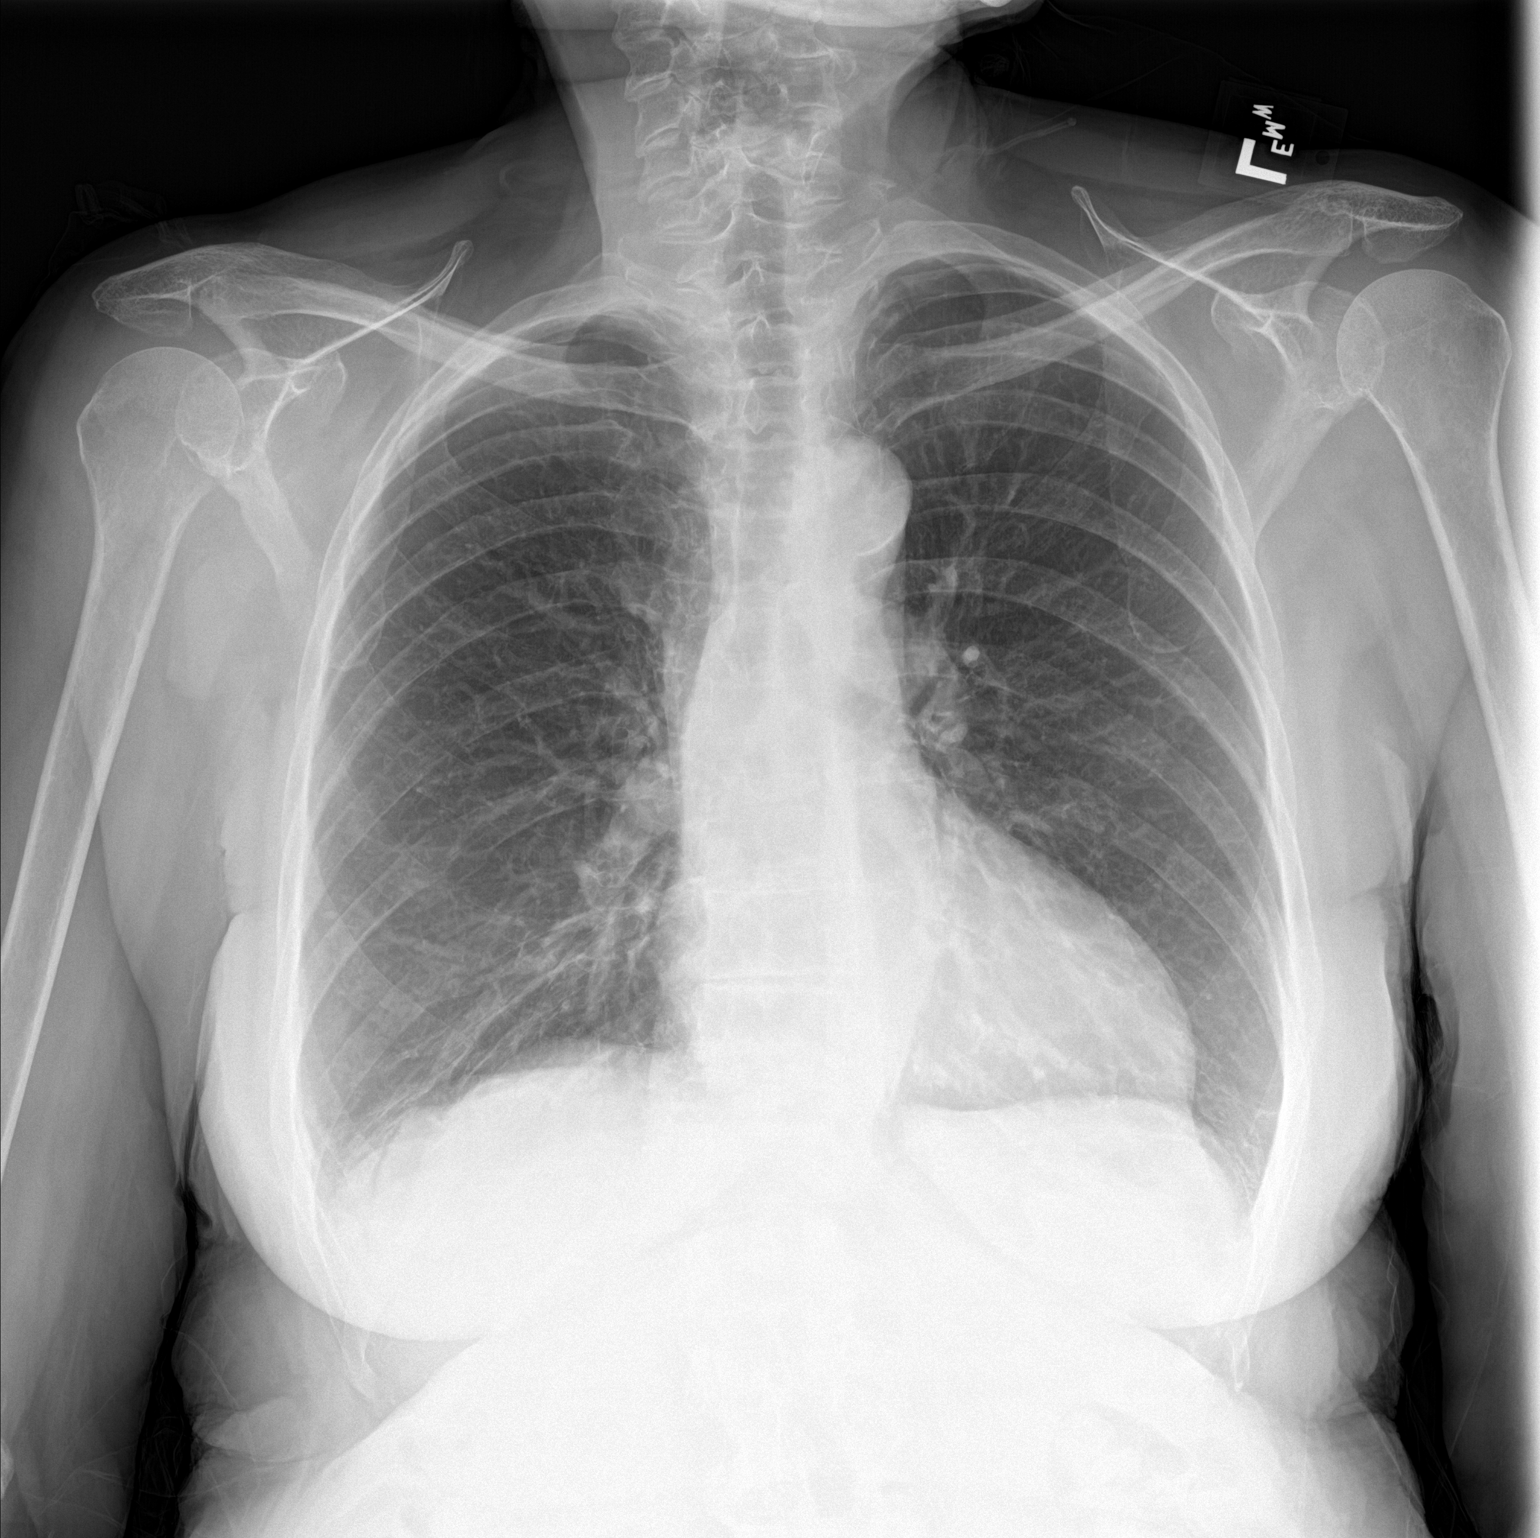

[chest lat]
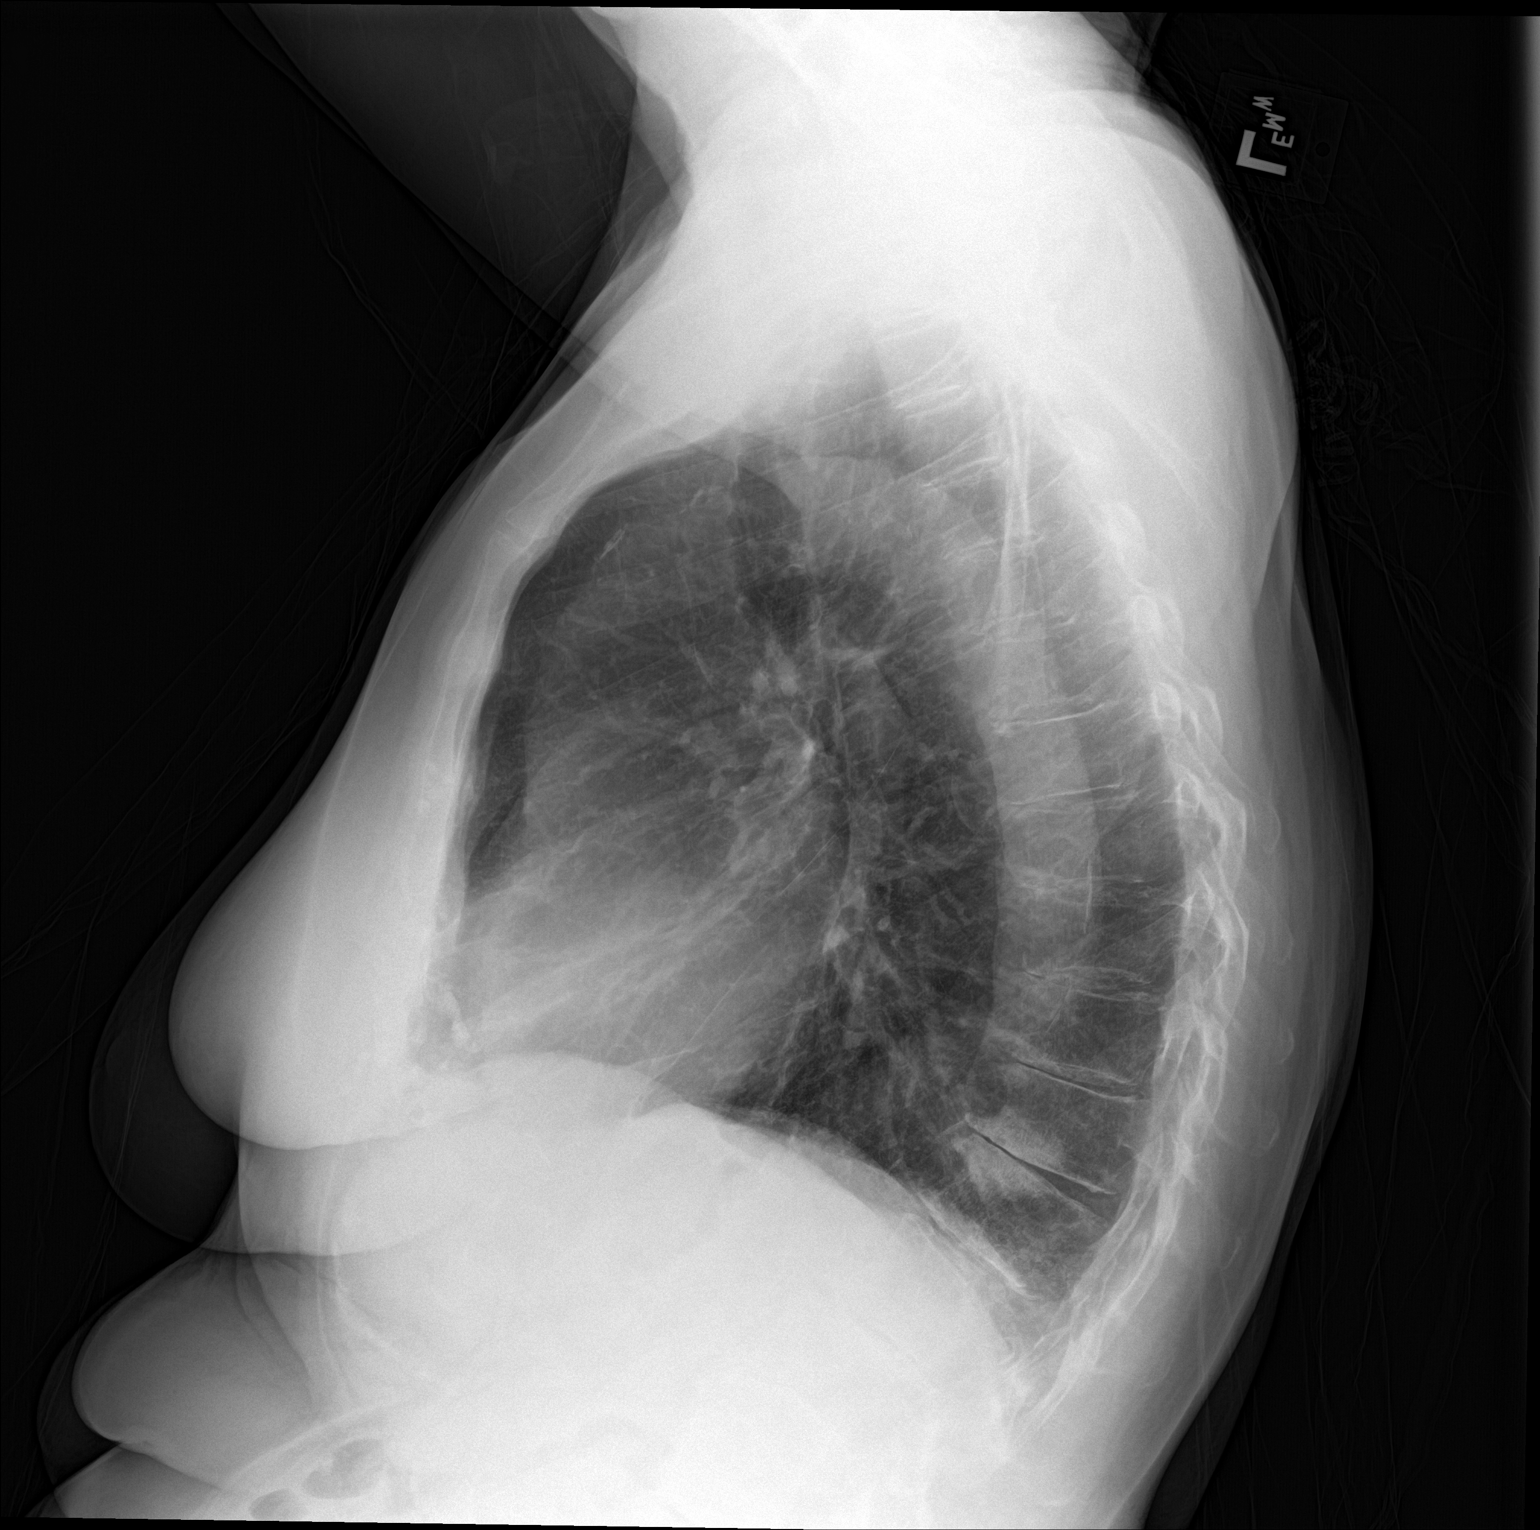

[2 of 2 positions shown; findings below may reference images not displayed]

FINDINGS: Cardiomediastinal silhouette is stable. No acute infiltrate or
pleural effusion. No pulmonary edema. Degenerative changes mid and
lower thoracic spine. Stable bilateral basilar atelectasis or
scarring.
IMPRESSION: No active disease. Degenerative changes mid and lower thoracic
spine. Stable bilateral basilar atelectasis or scarring.

## 2016-04-14 DIAGNOSIS — N3945 Continuous leakage: Secondary | ICD-10-CM | POA: Diagnosis not present

## 2016-04-14 DIAGNOSIS — Z6832 Body mass index (BMI) 32.0-32.9, adult: Secondary | ICD-10-CM | POA: Diagnosis not present

## 2016-04-14 DIAGNOSIS — Z124 Encounter for screening for malignant neoplasm of cervix: Secondary | ICD-10-CM | POA: Diagnosis not present

## 2016-04-14 DIAGNOSIS — Z1231 Encounter for screening mammogram for malignant neoplasm of breast: Secondary | ICD-10-CM | POA: Diagnosis not present

## 2016-04-16 DIAGNOSIS — E669 Obesity, unspecified: Secondary | ICD-10-CM | POA: Diagnosis not present

## 2016-04-16 DIAGNOSIS — M4806 Spinal stenosis, lumbar region: Secondary | ICD-10-CM | POA: Diagnosis not present

## 2016-04-16 DIAGNOSIS — Z23 Encounter for immunization: Secondary | ICD-10-CM | POA: Diagnosis not present

## 2016-04-16 DIAGNOSIS — Z1389 Encounter for screening for other disorder: Secondary | ICD-10-CM | POA: Diagnosis not present

## 2016-04-16 DIAGNOSIS — M8588 Other specified disorders of bone density and structure, other site: Secondary | ICD-10-CM | POA: Diagnosis not present

## 2016-04-16 DIAGNOSIS — R7309 Other abnormal glucose: Secondary | ICD-10-CM | POA: Diagnosis not present

## 2016-04-16 DIAGNOSIS — R32 Unspecified urinary incontinence: Secondary | ICD-10-CM | POA: Diagnosis not present

## 2016-04-16 DIAGNOSIS — J309 Allergic rhinitis, unspecified: Secondary | ICD-10-CM | POA: Diagnosis not present

## 2016-04-16 DIAGNOSIS — E7800 Pure hypercholesterolemia, unspecified: Secondary | ICD-10-CM | POA: Diagnosis not present

## 2016-04-16 DIAGNOSIS — Z6833 Body mass index (BMI) 33.0-33.9, adult: Secondary | ICD-10-CM | POA: Diagnosis not present

## 2016-04-16 DIAGNOSIS — M1990 Unspecified osteoarthritis, unspecified site: Secondary | ICD-10-CM | POA: Diagnosis not present

## 2016-04-16 DIAGNOSIS — I10 Essential (primary) hypertension: Secondary | ICD-10-CM | POA: Diagnosis not present

## 2016-04-16 DIAGNOSIS — K219 Gastro-esophageal reflux disease without esophagitis: Secondary | ICD-10-CM | POA: Diagnosis not present

## 2016-04-16 DIAGNOSIS — Z0000 Encounter for general adult medical examination without abnormal findings: Secondary | ICD-10-CM | POA: Diagnosis not present

## 2016-06-23 DIAGNOSIS — M8588 Other specified disorders of bone density and structure, other site: Secondary | ICD-10-CM | POA: Diagnosis not present

## 2016-09-25 ENCOUNTER — Other Ambulatory Visit: Payer: Self-pay | Admitting: Gastroenterology

## 2016-09-25 MED FILL — PEG-3350 SOLUTION: 420 | 1 days supply | Qty: 4000 | Fill #0

## 2016-10-14 DIAGNOSIS — M48061 Spinal stenosis, lumbar region without neurogenic claudication: Secondary | ICD-10-CM | POA: Diagnosis not present

## 2016-10-14 DIAGNOSIS — K219 Gastro-esophageal reflux disease without esophagitis: Secondary | ICD-10-CM | POA: Diagnosis not present

## 2016-10-14 DIAGNOSIS — R32 Unspecified urinary incontinence: Secondary | ICD-10-CM | POA: Diagnosis not present

## 2016-10-14 DIAGNOSIS — M8588 Other specified disorders of bone density and structure, other site: Secondary | ICD-10-CM | POA: Diagnosis not present

## 2016-10-14 DIAGNOSIS — I10 Essential (primary) hypertension: Secondary | ICD-10-CM | POA: Diagnosis not present

## 2016-11-16 DIAGNOSIS — L255 Unspecified contact dermatitis due to plants, except food: Secondary | ICD-10-CM | POA: Diagnosis not present

## 2016-11-20 ENCOUNTER — Encounter (HOSPITAL_COMMUNITY): Payer: Self-pay | Admitting: *Deleted

## 2016-11-24 ENCOUNTER — Ambulatory Visit (HOSPITAL_COMMUNITY)
Admission: RE | Admit: 2016-11-24 | Discharge: 2016-11-24 | Disposition: A | Discharge status: Home / Self Care | Payer: Medicare Other | Source: Ambulatory Visit | Attending: Gastroenterology | Admitting: Gastroenterology

## 2016-11-24 ENCOUNTER — Encounter (HOSPITAL_COMMUNITY)
Admission: RE | Disposition: A | Discharge status: Home / Self Care | Payer: Self-pay | Source: Ambulatory Visit | Attending: Gastroenterology

## 2016-11-24 ENCOUNTER — Ambulatory Visit (HOSPITAL_COMMUNITY): Payer: Medicare Other | Admitting: Anesthesiology

## 2016-11-24 ENCOUNTER — Encounter (HOSPITAL_COMMUNITY): Payer: Self-pay

## 2016-11-24 DIAGNOSIS — M48061 Spinal stenosis, lumbar region without neurogenic claudication: Secondary | ICD-10-CM | POA: Diagnosis not present

## 2016-11-24 DIAGNOSIS — Z1211 Encounter for screening for malignant neoplasm of colon: Secondary | ICD-10-CM | POA: Insufficient documentation

## 2016-11-24 DIAGNOSIS — Z87891 Personal history of nicotine dependence: Secondary | ICD-10-CM | POA: Diagnosis not present

## 2016-11-24 DIAGNOSIS — I10 Essential (primary) hypertension: Secondary | ICD-10-CM | POA: Insufficient documentation

## 2016-11-24 DIAGNOSIS — K219 Gastro-esophageal reflux disease without esophagitis: Secondary | ICD-10-CM | POA: Insufficient documentation

## 2016-11-24 DIAGNOSIS — Z86010 Personal history of colonic polyps: Secondary | ICD-10-CM | POA: Insufficient documentation

## 2016-11-24 DIAGNOSIS — M1990 Unspecified osteoarthritis, unspecified site: Secondary | ICD-10-CM | POA: Diagnosis not present

## 2016-11-24 HISTORY — DX: Anemia, unspecified: D64.9

## 2016-11-24 HISTORY — DX: Essential (primary) hypertension: I10

## 2016-11-24 HISTORY — DX: Allergic contact dermatitis due to plants, except food: L23.7

## 2016-11-24 HISTORY — DX: Scoliosis, unspecified: M41.9

## 2016-11-24 HISTORY — PX: COLONOSCOPY WITH PROPOFOL: SHX5780

## 2016-11-24 SURGERY — COLONOSCOPY WITH PROPOFOL
Anesthesia: Monitor Anesthesia Care

## 2016-11-24 MED ORDER — LACTATED RINGERS IV SOLN
INTRAVENOUS | Status: DC
  Administered 2016-11-24: 1000 mL via INTRAVENOUS

## 2016-11-24 MED ORDER — PROPOFOL 10 MG/ML IV BOLUS
INTRAVENOUS | Status: DC | PRN
  Administered 2016-11-24 (×2): 40 mg via INTRAVENOUS
  Administered 2016-11-24 (×2): 20 mg via INTRAVENOUS

## 2016-11-24 MED ORDER — PROPOFOL 10 MG/ML IV BOLUS
INTRAVENOUS | Status: AC
  Filled 2016-11-24: qty 40

## 2016-11-24 MED ORDER — PROPOFOL 500 MG/50ML IV EMUL
INTRAVENOUS | Status: DC | PRN
  Administered 2016-11-24: 75 ug/kg/min via INTRAVENOUS

## 2016-11-24 MED ORDER — SODIUM CHLORIDE 0.9 % IV SOLN
INTRAVENOUS | Status: DC
Start: 2016-11-24 — End: 2016-11-24

## 2016-11-24 SURGICAL SUPPLY — 22 items

## 2016-11-24 NOTE — Transfer of Care (Signed)
Immediate Anesthesia Transfer of Care Note  Patient: Dominique Cruz  Procedure(s) Performed: Procedure(s): COLONOSCOPY WITH PROPOFOL (N/A)  Patient Location: PACU  Anesthesia Type:MAC  Level of Consciousness:  sedated, patient cooperative and responds to stimulation  Airway & Oxygen Therapy:Patient Spontanous Breathing and Patient connected to face mask oxgen  Post-op Assessment:  Report given to PACU RN and Post -op Vital signs reviewed and stable  Post vital signs:  Reviewed and stable  Last Vitals:  Vitals:   11/24/16 1028  BP: (!) 160/104  Pulse: 70  Resp: 10  Temp: 25.3 C    Complications: No apparent anesthesia complications

## 2016-11-24 NOTE — Op Note (Signed)
[facility] Patient Name: Dominique Cruz Procedure Date: 11/24/2016 MRN: [id number] Attending MD: Charolett Bumpers , MD Date of Birth: 10-19-1940 CSN: [id number] Age: 76 Admit Type: Outpatient Procedure:                Colonoscopy Indications:              High risk colon cancer surveillance: Personal                            history of adenoma less than 10 mm in size Providers:                Charolett Bumpers, MD, Dwain Sarna, RN, Zoila Shutter, Technician, Harrington Challenger, Technician Referring MD:              Medicines:                Propofol per Anesthesia Complications:            No immediate complications. Estimated Blood Loss:     Estimated blood loss: none. Procedure:                Pre-Anesthesia Assessment:                           - Prior to the procedure, a History and Physical                            was performed, and patient medications and                            allergies were reviewed. The patient's tolerance of                            previous anesthesia was also reviewed. The risks                            and benefits of the procedure and the sedation                            options and risks were discussed with the patient.                            All questions were answered, and informed consent                            was obtained. Prior Anticoagulants: The patient has                            taken no previous anticoagulant or antiplatelet                            agents. ASA Grade Assessment: II - A patient with  mild systemic disease. After reviewing the risks                            and benefits, the patient was deemed in                            satisfactory condition to undergo the procedure.                           After obtaining informed consent, the colonoscope                            was passed under direct vision. Throughout the             procedure, the patient's blood pressure, pulse, and                            oxygen saturations were monitored continuously. The                            was introduced through the anus and advanced to the                            the cecum, identified by appendiceal orifice and                            ileocecal valve. The colonoscopy was performed                            without difficulty. The patient tolerated the                            procedure well. The quality of the bowel                            preparation was good. The appendiceal orifice and                            the rectum were photographed. Scope In: 11:04:24 AM Scope Out: 11:23:41 AM Total Procedure Duration: 0 hours 19 minutes 17 seconds  Findings:      The perianal and digital rectal examinations were normal.      The entire examined colon appeared normal. Impression:               - The entire examined colon is normal.                           - No specimens collected. Moderate Sedation:      N/A- Per Anesthesia Care Recommendation:           - Patient has a contact number available for                            emergencies. The signs and symptoms of potential  delayed complications were discussed with the                            patient. Return to normal activities tomorrow.                            Written discharge instructions were provided to the                            patient.                           - Repeat colonoscopy is not recommended for                            surveillance.                           - Resume previous diet.                           - Continue present medications. Procedure Code(s):        --- Professional ---                           N6295, Colorectal cancer screening; colonoscopy on                            individual at high risk Diagnosis Code(s):        --- Professional ---                            Z86.010, Personal history of colonic polyps CPT copyright 2016 American Medical Association. All rights reserved. The codes documented in this report are preliminary and upon coder review may  be revised to meet current compliance requirements. Danise Edge, MD Charolett Bumpers, MD 11/24/2016 11:29:28 AM This report has been signed electronically. Number of Addenda: 0

## 2016-11-24 NOTE — Anesthesia Preprocedure Evaluation (Signed)
Anesthesia Evaluation  Patient identified by MRN, date of birth, ID band Patient awake    Reviewed: Allergy & Precautions, NPO status , Patient's Chart, lab work & pertinent test results  Airway Mallampati: II  TM Distance: >3 FB Neck ROM: Full    Dental no notable dental hx.    Pulmonary neg pulmonary ROS, former smoker,    Pulmonary exam normal breath sounds clear to auscultation       Cardiovascular hypertension, Normal cardiovascular exam Rhythm:Regular Rate:Normal     Neuro/Psych negative neurological ROS  negative psych ROS   GI/Hepatic negative GI ROS, Neg liver ROS,   Endo/Other  negative endocrine ROS  Renal/GU negative Renal ROS  negative genitourinary   Musculoskeletal negative musculoskeletal ROS (+)   Abdominal   Peds negative pediatric ROS (+)  Hematology negative hematology ROS (+)   Anesthesia Other Findings   Reproductive/Obstetrics negative OB ROS                             Anesthesia Physical Anesthesia Plan  ASA: II  Anesthesia Plan: MAC   Post-op Pain Management:    Induction: Intravenous  Airway Management Planned: Simple Face Mask  Additional Equipment:   Intra-op Plan:   Post-operative Plan:   Informed Consent: I have reviewed the patients History and Physical, chart, labs and discussed the procedure including the risks, benefits and alternatives for the proposed anesthesia with the patient or authorized representative who has indicated his/her understanding and acceptance.   Dental advisory given  Plan Discussed with: CRNA and Surgeon  Anesthesia Plan Comments:         Anesthesia Quick Evaluation  

## 2016-11-24 NOTE — H&P (Signed)
Procedure: Surveillance colonoscopy. 02/28/2010 colonoscopy was performed with removal of a 4 mm tubular adenomatous descending colon polyp  History: The patient is a 75 year old female born 11-May-1941. She is scheduled to undergo a surveillance colonoscopy today.  Past medical history: Gastroesophageal reflux. Lumbar spinal stenosis. Osteoarthritis. Demyelinating polyneuropathy bilateral lower extremities. Right ankle surgery.  Exam: The patient is alert and lying comfortably on the endoscopy stretcher. Cardiac exam reveals a regular rhythm with extrasystoles. Lungs are clear to auscultation. Abdomen is soft and nontender to palpation.  Plan: Proceed with surveillance colonoscopy

## 2016-11-24 NOTE — Anesthesia Postprocedure Evaluation (Signed)
Anesthesia Post Note  Patient: Dominique Cruz  Procedure(s) Performed: Procedure(s) (LRB): COLONOSCOPY WITH PROPOFOL (N/A)  Patient location during evaluation: PACU Anesthesia Type: MAC Level of consciousness: awake and alert Pain management: pain level controlled Vital Signs Assessment: post-procedure vital signs reviewed and stable Respiratory status: spontaneous breathing, nonlabored ventilation, respiratory function stable and patient connected to nasal cannula oxygen Cardiovascular status: stable and blood pressure returned to baseline Anesthetic complications: no       Last Vitals:  Vitals:   11/24/16 1028 11/24/16 1130  BP: (!) 160/104 136/64  Pulse: 70 65  Resp: 10 15  Temp: 36.6 C 36.4 C    Last Pain:  Vitals:   11/24/16 1130  TempSrc: Oral                 Malayjah Otoole S

## 2016-11-24 NOTE — Discharge Instructions (Signed)

## 2016-11-25 ENCOUNTER — Encounter (HOSPITAL_COMMUNITY): Payer: Self-pay | Admitting: Gastroenterology

## 2017-01-05 NOTE — Anesthesia Postprocedure Evaluation (Signed)
Anesthesia Post Note  Patient: Dominique MeigsBarbara Cruz  Procedure(s) Performed: Procedure(s) (LRB): COLONOSCOPY WITH PROPOFOL (N/A)     Anesthesia Post Evaluation  Last Vitals:  Vitals:   11/24/16 1209 11/24/16 1220  BP: (!) 199/86 (!) 195/66  Pulse: (!) 58 60  Resp: 14 13  Temp:      Last Pain:  Vitals:   11/26/16 1316  TempSrc:   PainSc: 0-No pain                 Lougenia Morrissey S

## 2017-01-05 NOTE — Addendum Note (Signed)
Addendum  created 01/05/17 1337 by Christoph Copelan, MD   Sign clinical note    

## 2017-03-02 MED FILL — OLMESARTAN MEDOXOMIL 20 MG: 20 | 7 days supply | Qty: 7 | Fill #0

## 2017-03-30 ENCOUNTER — Ambulatory Visit: Payer: Self-pay | Admitting: Podiatry

## 2017-04-10 DIAGNOSIS — Z23 Encounter for immunization: Secondary | ICD-10-CM | POA: Diagnosis not present

## 2017-05-20 ENCOUNTER — Ambulatory Visit (INDEPENDENT_AMBULATORY_CARE_PROVIDER_SITE_OTHER): Payer: Medicare Other | Admitting: Podiatry

## 2017-05-20 ENCOUNTER — Encounter: Payer: Self-pay | Admitting: Podiatry

## 2017-05-20 VITALS — BP 128/59 | HR 72 | Ht 64.0 in | Wt 180.0 lb

## 2017-05-20 DIAGNOSIS — M2141 Flat foot [pes planus] (acquired), right foot: Secondary | ICD-10-CM

## 2017-05-20 DIAGNOSIS — G5761 Lesion of plantar nerve, right lower limb: Secondary | ICD-10-CM | POA: Diagnosis not present

## 2017-05-20 DIAGNOSIS — M2142 Flat foot [pes planus] (acquired), left foot: Secondary | ICD-10-CM

## 2017-05-20 DIAGNOSIS — M216X2 Other acquired deformities of left foot: Secondary | ICD-10-CM

## 2017-05-20 DIAGNOSIS — M216X1 Other acquired deformities of right foot: Secondary | ICD-10-CM

## 2017-05-20 DIAGNOSIS — M21969 Unspecified acquired deformity of unspecified lower leg: Secondary | ICD-10-CM | POA: Diagnosis not present

## 2017-05-20 NOTE — Patient Instructions (Signed)
Seen for painful nerve on right foot. Noted of weak instep with flattened arch. Reviewed findings and options, proper shoe gear, orthotics, possible injection as needed. Continue with current level of care.

## 2017-05-20 NOTE — Progress Notes (Signed)
SUBJECTIVE: 76 y.o. year old female presents with neuroma pain on right foot duration of 4-5 years. Tolerable as long as using pad under the foot. Does walking exercise moderate. Also using custom orthotics for the past 3-4 years. Patient was seen here possibly about 4 years ago for the same problem. Patient has been coming to this office to purchase padding as needed in past.  Hx of right foot bunion and posterior tibial tendon surgery many years ago by a different provider.  Taking medication for heart burn, incontinence, occasional allergies, and hypertension.  REVIEW OF SYSTEMS: Review of Systems  Constitutional: Negative for chills, fever, malaise/fatigue and weight loss.  HENT: Negative for ear discharge, ear pain, hearing loss and tinnitus.   Eyes: Negative for blurred vision, double vision, photophobia and pain.  Respiratory: Negative for cough and shortness of breath.   Cardiovascular: Negative for chest pain, palpitations, orthopnea, claudication and leg swelling.  Gastrointestinal: Positive for heartburn. Negative for abdominal pain, diarrhea, nausea and vomiting.  Genitourinary: Positive for frequency. Negative for dysuria and urgency.  Musculoskeletal: Positive for joint pain. Negative for back pain, myalgias and neck pain.  Skin: Negative for itching and rash.  Neurological: Negative for dizziness, tremors and headaches.  Suffering from arthritic painful joints in fingers.   OBJECTIVE: DERMATOLOGIC EXAMINATION:  Normal findings.  VASCULAR EXAMINATION OF LOWER LIMBS: All pedal pulses are palpable with normal pulsation.  Positive of lymphedema on both lower limbs, spared on foot. Capillary Filling times within 3 seconds in all digits.  Temperature gradient from tibial crest to dorsum of foot is within normal bilateral.  NEUROLOGIC EXAMINATION OF THE LOWER LIMBS: All epicritic and tactile sensations grossly intact. Sharp and Dull discriminatory sensations at the plantar  ball of hallux is intact bilateral.   MUSCULOSKELETAL EXAMINATION: Positive for hypermobile first ray bilateral. Flat instep with minimum medial arch bilateral. Severe STJ pronation bilateral. Pain under the 3rd intermetatarsal space of right foot with weight bearing.  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 PLAN:

## 2017-06-02 DIAGNOSIS — J309 Allergic rhinitis, unspecified: Secondary | ICD-10-CM | POA: Diagnosis not present

## 2017-06-02 DIAGNOSIS — Z0000 Encounter for general adult medical examination without abnormal findings: Secondary | ICD-10-CM | POA: Diagnosis not present

## 2017-06-02 DIAGNOSIS — M8588 Other specified disorders of bone density and structure, other site: Secondary | ICD-10-CM | POA: Diagnosis not present

## 2017-06-02 DIAGNOSIS — R32 Unspecified urinary incontinence: Secondary | ICD-10-CM | POA: Diagnosis not present

## 2017-06-02 DIAGNOSIS — Z1389 Encounter for screening for other disorder: Secondary | ICD-10-CM | POA: Diagnosis not present

## 2017-06-02 DIAGNOSIS — E669 Obesity, unspecified: Secondary | ICD-10-CM | POA: Diagnosis not present

## 2017-06-02 DIAGNOSIS — M1990 Unspecified osteoarthritis, unspecified site: Secondary | ICD-10-CM | POA: Diagnosis not present

## 2017-06-02 DIAGNOSIS — K219 Gastro-esophageal reflux disease without esophagitis: Secondary | ICD-10-CM | POA: Diagnosis not present

## 2017-06-02 DIAGNOSIS — R7309 Other abnormal glucose: Secondary | ICD-10-CM | POA: Diagnosis not present

## 2017-06-02 DIAGNOSIS — E7800 Pure hypercholesterolemia, unspecified: Secondary | ICD-10-CM | POA: Diagnosis not present

## 2017-06-02 DIAGNOSIS — I10 Essential (primary) hypertension: Secondary | ICD-10-CM | POA: Diagnosis not present

## 2017-06-02 DIAGNOSIS — M48061 Spinal stenosis, lumbar region without neurogenic claudication: Secondary | ICD-10-CM | POA: Diagnosis not present

## 2017-06-09 DIAGNOSIS — H35033 Hypertensive retinopathy, bilateral: Secondary | ICD-10-CM | POA: Diagnosis not present

## 2017-06-09 DIAGNOSIS — H26493 Other secondary cataract, bilateral: Secondary | ICD-10-CM | POA: Diagnosis not present

## 2017-06-30 DIAGNOSIS — I10 Essential (primary) hypertension: Secondary | ICD-10-CM | POA: Diagnosis not present

## 2017-06-30 DIAGNOSIS — M1990 Unspecified osteoarthritis, unspecified site: Secondary | ICD-10-CM | POA: Diagnosis not present

## 2017-07-07 DIAGNOSIS — Z01419 Encounter for gynecological examination (general) (routine) without abnormal findings: Secondary | ICD-10-CM | POA: Diagnosis not present

## 2017-07-07 DIAGNOSIS — Z6834 Body mass index (BMI) 34.0-34.9, adult: Secondary | ICD-10-CM | POA: Diagnosis not present

## 2017-07-07 DIAGNOSIS — N3945 Continuous leakage: Secondary | ICD-10-CM | POA: Diagnosis not present

## 2017-07-07 DIAGNOSIS — Z1231 Encounter for screening mammogram for malignant neoplasm of breast: Secondary | ICD-10-CM | POA: Diagnosis not present

## 2017-07-29 ENCOUNTER — Encounter (HOSPITAL_BASED_OUTPATIENT_CLINIC_OR_DEPARTMENT_OTHER): Payer: Self-pay

## 2017-07-29 ENCOUNTER — Other Ambulatory Visit: Payer: Self-pay

## 2017-07-29 ENCOUNTER — Emergency Department (HOSPITAL_BASED_OUTPATIENT_CLINIC_OR_DEPARTMENT_OTHER)
Admission: EM | Admit: 2017-07-29 | Discharge: 2017-07-30 | Disposition: A | Discharge status: Home / Self Care | Payer: Medicare Other | Attending: Emergency Medicine | Admitting: Emergency Medicine

## 2017-07-29 ENCOUNTER — Emergency Department (HOSPITAL_BASED_OUTPATIENT_CLINIC_OR_DEPARTMENT_OTHER): Payer: Medicare Other

## 2017-07-29 DIAGNOSIS — Z87891 Personal history of nicotine dependence: Secondary | ICD-10-CM | POA: Insufficient documentation

## 2017-07-29 DIAGNOSIS — I700 Atherosclerosis of aorta: Secondary | ICD-10-CM | POA: Diagnosis not present

## 2017-07-29 DIAGNOSIS — Z79899 Other long term (current) drug therapy: Secondary | ICD-10-CM | POA: Insufficient documentation

## 2017-07-29 DIAGNOSIS — R197 Diarrhea, unspecified: Secondary | ICD-10-CM | POA: Diagnosis not present

## 2017-07-29 DIAGNOSIS — R55 Syncope and collapse: Secondary | ICD-10-CM | POA: Diagnosis not present

## 2017-07-29 DIAGNOSIS — Z86718 Personal history of other venous thrombosis and embolism: Secondary | ICD-10-CM | POA: Diagnosis not present

## 2017-07-29 DIAGNOSIS — Z7982 Long term (current) use of aspirin: Secondary | ICD-10-CM | POA: Diagnosis not present

## 2017-07-29 DIAGNOSIS — R109 Unspecified abdominal pain: Secondary | ICD-10-CM | POA: Diagnosis not present

## 2017-07-29 DIAGNOSIS — I10 Essential (primary) hypertension: Secondary | ICD-10-CM | POA: Insufficient documentation

## 2017-07-29 DIAGNOSIS — R1031 Right lower quadrant pain: Secondary | ICD-10-CM | POA: Diagnosis not present

## 2017-07-29 LAB — COMPREHENSIVE METABOLIC PANEL
ALT: 16 U/L (ref 14–54)
AST: 20 U/L (ref 15–41)
Albumin: 3.9 g/dL (ref 3.5–5.0)
Alkaline Phosphatase: 56 U/L (ref 38–126)
Anion gap: 9 (ref 5–15)
BUN: 20 mg/dL (ref 6–20)
CO2: 25 mmol/L (ref 22–32)
Calcium: 8.8 mg/dL — ABNORMAL LOW (ref 8.9–10.3)
Chloride: 102 mmol/L (ref 101–111)
Creatinine, Ser: 0.64 mg/dL (ref 0.44–1.00)
GFR calc Af Amer: >60 mL/min (ref >60)
GFR calc non Af Amer: >60 mL/min (ref >60)
Glucose, Bld: 106 mg/dL — ABNORMAL HIGH (ref 65–99)
Potassium: 3.9 mmol/L (ref 3.5–5.1)
Sodium: 136 mmol/L (ref 135–145)
Total Bilirubin: 0.5 mg/dL (ref 0.3–1.2)
Total Protein: 6.7 g/dL (ref 6.5–8.1)

## 2017-07-29 LAB — CBC
HCT: 38.6 % (ref 36.0–46.0)
Hemoglobin: 12.4 g/dL (ref 12.0–15.0)
MCH: 28.3 pg (ref 26.0–34.0)
MCHC: 32.1 g/dL (ref 30.0–36.0)
MCV: 88.1 fL (ref 78.0–100.0)
Platelets: 271 10*3/uL (ref 150–400)
RBC: 4.38 MIL/uL (ref 3.87–5.11)
RDW: 14.7 % (ref 11.5–15.5)
WBC: 6.9 10*3/uL (ref 4.0–10.5)

## 2017-07-29 LAB — URINALYSIS, ROUTINE W REFLEX MICROSCOPIC
Bilirubin Urine: NEGATIVE
Glucose, UA: NEGATIVE mg/dL
Hgb urine dipstick: NEGATIVE
Ketones, ur: 15 mg/dL — AB
Leukocytes, UA: NEGATIVE
Nitrite: NEGATIVE
Protein, ur: 30 mg/dL — AB
Specific Gravity, Urine: 1.025 (ref 1.005–1.030)
pH: 6 (ref 5.0–8.0)

## 2017-07-29 LAB — TROPONIN I: Troponin I: <0.03 ng/mL (ref <0.03)

## 2017-07-29 LAB — URINALYSIS, MICROSCOPIC (REFLEX): RBC / HPF: NONE SEEN RBC/hpf (ref 0–5)

## 2017-07-29 LAB — D-DIMER, QUANTITATIVE: D-Dimer, Quant: 0.69 ug/mL-FEU — ABNORMAL HIGH (ref 0.00–0.50)

## 2017-07-29 LAB — LIPASE, BLOOD: Lipase: 41 U/L (ref 11–51)

## 2017-07-29 MED ORDER — IOPAMIDOL (ISOVUE-370) INJECTION 76%
100.0000 mL | Freq: Once | INTRAVENOUS | Status: AC | PRN
  Administered 2017-07-29: 100 mL via INTRAVENOUS

## 2017-07-29 NOTE — ED Notes (Signed)
Patient transported to CT 

## 2017-07-29 NOTE — ED Triage Notes (Signed)
C/o abd pain, dizziness, sweats, nausea x 30 min PTA-diarrhea yesterday-NAD-steady gait

## 2017-07-29 NOTE — ED Provider Notes (Signed)
[facility] EMERGENCY DEPARTMENT Provider Note   CSN: [id number] Arrival date & time: 07/29/17  1647     History   Chief Complaint Chief Complaint  Patient presents with  . Abdominal Pain    HPI Dominique Cruz is a 76 y.o. female.  HPI Patient reports she has history of pulmonary embolus a couple of years ago.  Was in the setting of long travel.  She is no longer anticoagulated.  Patient reports that she had several episodes of diarrhea yesterday evening.  She was feeling better today.  She was in the kitchen doing some cooking when she started to feel very dizzy, got sweaty and pale.  She reports at that time she was having some sensation that she might need to have a bowel movement.  She had to sit down and her husband reports she was very clammy.  Denies she specifically experienced chest pain. After  About 30 minutes the symptoms resolved and now she reports being asymptomatic.  She reports for months she has had a problem with intermittent sharp right-sided abdominal pain.  That comes and goes.  She denies that she is having it at this time. Past Medical History:  Diagnosis Date  . Anemia   . GERD (gastroesophageal reflux disease)   . History of chicken pox   . Hypertension   . Mumps   . Overactive bladder   . PE (pulmonary embolism)    2012  . Poison ivy    both arms healing well on prednisone   . Scoliosis   . Swelling    FEET AND LEGS B/L  . Weakness of left leg     Patient Active Problem List   Diagnosis Date Noted  . Gastroesophageal reflux disease without esophagitis 06/05/2015  . OAB (overactive bladder) 06/05/2015  . Seasonal allergies 06/05/2015  . Left hip pain 12/22/2014    Past Surgical History:  Procedure Laterality Date  . COLONOSCOPY WITH PROPOFOL N/A 11/24/2016   Procedure: COLONOSCOPY WITH PROPOFOL;  Surgeon: Charolett Bumpers, MD;  Location: [facility];  Service: Endoscopy;  Laterality: N/A;  . FOOT SURGERY Bilateral    BUNION  .  FOOT SURGERY Right    TENDON right ankle  . KNEE SURGERY Right    arthrscopy and meniscus repair    OB History    No data available       Home Medications    Prior to Admission medications   Medication Sig Start Date End Date Taking? Authorizing Provider  acetaminophen (TYLENOL) 650 MG CR tablet Take 650 mg by mouth every 8 (eight) hours as needed for pain.    [provider]  ARTIFICIAL TEAR OP Place 1 drop into both eyes 2 (two) times daily.    [provider]  aspirin 81 MG tablet Take 81 mg by mouth daily.    [provider]  Calcium Carbonate-Vitamin D (CALTRATE 600+D) 600-400 MG-UNIT per tablet Take 1 tablet by mouth daily.     [provider]  Coenzyme Q10 (CO Q 10) 100 MG CAPS Take 100 mg by mouth daily.    [provider]  diphenhydrAMINE (BENADRYL) 25 mg capsule Take 25 mg by mouth every 6 (six) hours as needed for itching.    [provider]  Ferrous Sulfate (SLOW FE PO) Take 1 tablet by mouth daily.    [provider]  fish oil-omega-3 fatty acids 1000 MG capsule Take 1 g by mouth daily.      [provider]  fluticasone (FLONASE) 50 MCG/ACT nasal spray Place 2 sprays into both nostrils daily as needed for allergies.  07/12/13   [provider]  magnesium oxide (MAG-OX) 400 MG tablet Take 400 mg by mouth daily.      [provider]  Multiple Vitamin (MULTIVITAMIN) tablet Take 1 tablet by mouth daily.      [provider]  oxybutynin (DITROPAN) 5 MG tablet Take 2.5 mg by mouth 2 (two) times daily.    [provider]  pantoprazole (PROTONIX) 20 MG tablet Take 20 mg by mouth daily. 09/09/16   [provider]  Potassium 99 MG TABS Take 1 tablet by mouth daily.      [provider]  valsartan (DIOVAN) 40 MG tablet Take 40 mg by mouth daily. 08/13/16   [provider]  vitamin B-12 (CYANOCOBALAMIN) 1000 MCG tablet Take 1,000 mcg by mouth daily.       [provider]  vitamin E 400 UNIT capsule Take 400 Units by mouth daily.      [provider]    Family History Family History  Problem Relation Age of Onset  . Arthritis Father   . Heart disease Father   . Allergies Father   . Colon cancer Mother   . Heart disease Mother   . Mental illness Mother   . Allergies Mother   . Multiple sclerosis Brother   . Prostate cancer Brother   . Stroke Brother   . Healthy Son        x1  . Healthy Daughter        x1    Social History Social History   Tobacco Use  . Smoking status: Former Smoker    Types: Cigarettes    Last attempt to quit: 05/11/1961    Years since quitting: 56.2  . Smokeless tobacco: Never Used  . Tobacco comment: smoked few years  Substance Use Topics  . Alcohol use: Yes    Alcohol/week: 0.0 oz    Comment: weekly  . Drug use: No     Allergies   Ao-disc catalyst and Sulfa drugs cross reactors   Review of Systems Review of Systems 10 Systems reviewed and are negative for acute change except as noted in the HPI.   Physical Exam Updated Vital Signs BP 136/77 (BP Location: Right Arm)   Pulse 71   Temp 97.8 F (36.6 C) (Oral)   Resp 16   Ht 5\' 2"  (1.575 m)   Wt 79.4 kg (175 lb)   SpO2 100%   BMI 32.01 kg/m   Physical Exam  Constitutional: She is oriented to person, place, and time. She appears well-developed and well-nourished. No distress.  HENT:  Head: Normocephalic and atraumatic.  Nose: Nose normal.  Mouth/Throat: Oropharynx is clear and moist.  Eyes: Conjunctivae and EOM are normal.  Neck: Neck supple.  Cardiovascular: Normal rate, regular rhythm, normal heart sounds and intact distal pulses.  No murmur heard. Pulmonary/Chest: Effort normal and breath sounds normal. No respiratory distress.  Abdominal: Soft. She exhibits no distension. There is no tenderness. There is no guarding.  Musculoskeletal: She exhibits no edema or tenderness.  Neurological: She is alert and  oriented to person, place, and time. No cranial nerve deficit. She exhibits normal muscle tone. Coordination normal.  Skin: Skin is warm and dry.  Psychiatric: She has a normal mood and affect.  Nursing note and vitals reviewed.    ED Treatments / Results  Labs (all labs ordered are  listed, but only abnormal results are displayed) Labs Reviewed  COMPREHENSIVE METABOLIC PANEL - Abnormal; Notable for the following components:      Result Value   Glucose, Bld 106 (*)    Calcium 8.8 (*)    All other components within normal limits  URINALYSIS, ROUTINE W REFLEX MICROSCOPIC - Abnormal; Notable for the following components:   Ketones, ur 15 (*)    Protein, ur 30 (*)    All other components within normal limits  URINALYSIS, MICROSCOPIC (REFLEX) - Abnormal; Notable for the following components:   Bacteria, UA RARE (*)    Squamous Epithelial / LPF 0-5 (*)    All other components within normal limits  D-DIMER, QUANTITATIVE (NOT AT [facility]) - Abnormal; Notable for the following components:   D-Dimer, Quant 0.69 (*)    All other components within normal limits  LIPASE, BLOOD  CBC  TROPONIN I    EKG  EKG Interpretation  Date/Time:  Wednesday July 29 2017 17:17:11 EST Ventricular Rate:  60 PR Interval:  148 QRS Duration: 72 QT Interval:  436 QTC Calculation: 436 R Axis:   65 Text Interpretation:  Normal sinus rhythm Normal ECG agree. no change from previous Confirmed by Arby Barrette [phone]) on 07/29/2017 8:17:54 PM       Radiology No results found.  Procedures Procedures (including critical care time)  Medications Ordered in ED Medications  iopamidol (ISOVUE-370) 76 % injection 100 mL (100 mLs Intravenous Contrast Given 07/29/17 2315)     Initial Impression / Assessment and Plan / ED Course  I have reviewed the triage vital signs and the nursing notes.  Pertinent labs & imaging results that were available during my care of the patient were reviewed by me and  considered in my medical decision making (see chart for details).      Final Clinical Impressions(s) / ED Diagnoses   Final diagnoses:  Near syncope  Right lower quadrant abdominal pain  Diarrhea, unspecified type  History of DVT (deep vein thrombosis)   Diagnostic workup is within normal limits.  Patient did have personal history of PE and d-dimer was slightly elevated.  However there is no PE present on CT scan.  No signs of intra-abdominal pathology.  Time most likely patient experienced vasovagal event in conjunction with lower abdominal pain.  She has had recent diarrheal illness.  She is clinically well in appearance at this time.  Return precautions are reviewed. ED Discharge Orders    None       Arby Barrette, MD 07/30/17 [phone]

## 2017-07-29 NOTE — ED Notes (Signed)
Called lab to add on orders.

## 2017-07-29 NOTE — ED Notes (Signed)
EDP at bedside now. °

## 2017-07-30 NOTE — ED Notes (Signed)
ED Provider at bedside. 

## 2017-08-24 DIAGNOSIS — H43392 Other vitreous opacities, left eye: Secondary | ICD-10-CM | POA: Diagnosis not present

## 2017-09-30 DIAGNOSIS — I10 Essential (primary) hypertension: Secondary | ICD-10-CM | POA: Diagnosis not present

## 2017-09-30 DIAGNOSIS — R7982 Elevated C-reactive protein (CRP): Secondary | ICD-10-CM | POA: Diagnosis not present

## 2017-10-07 DIAGNOSIS — Z86711 Personal history of pulmonary embolism: Secondary | ICD-10-CM | POA: Diagnosis not present

## 2017-10-29 DIAGNOSIS — I10 Essential (primary) hypertension: Secondary | ICD-10-CM | POA: Diagnosis not present

## 2017-10-29 DIAGNOSIS — M1990 Unspecified osteoarthritis, unspecified site: Secondary | ICD-10-CM | POA: Diagnosis not present

## 2017-12-01 ENCOUNTER — Other Ambulatory Visit: Payer: Self-pay | Admitting: Internal Medicine

## 2017-12-01 DIAGNOSIS — N83201 Unspecified ovarian cyst, right side: Secondary | ICD-10-CM | POA: Diagnosis not present

## 2017-12-01 DIAGNOSIS — I700 Atherosclerosis of aorta: Secondary | ICD-10-CM | POA: Diagnosis not present

## 2017-12-01 DIAGNOSIS — K219 Gastro-esophageal reflux disease without esophagitis: Secondary | ICD-10-CM | POA: Diagnosis not present

## 2017-12-01 DIAGNOSIS — M1990 Unspecified osteoarthritis, unspecified site: Secondary | ICD-10-CM | POA: Diagnosis not present

## 2017-12-01 DIAGNOSIS — R32 Unspecified urinary incontinence: Secondary | ICD-10-CM | POA: Diagnosis not present

## 2017-12-01 DIAGNOSIS — E7800 Pure hypercholesterolemia, unspecified: Secondary | ICD-10-CM | POA: Diagnosis not present

## 2017-12-01 DIAGNOSIS — R7309 Other abnormal glucose: Secondary | ICD-10-CM | POA: Diagnosis not present

## 2017-12-01 DIAGNOSIS — I10 Essential (primary) hypertension: Secondary | ICD-10-CM | POA: Diagnosis not present

## 2017-12-08 ENCOUNTER — Ambulatory Visit
Admission: RE | Admit: 2017-12-08 | Discharge: 2017-12-08 | Disposition: A | Discharge status: Home / Self Care | Payer: Medicare Other | Source: Ambulatory Visit | Attending: Internal Medicine | Admitting: Internal Medicine

## 2017-12-08 DIAGNOSIS — N83201 Unspecified ovarian cyst, right side: Secondary | ICD-10-CM

## 2017-12-24 DIAGNOSIS — R0981 Nasal congestion: Secondary | ICD-10-CM | POA: Diagnosis not present

## 2017-12-24 DIAGNOSIS — J309 Allergic rhinitis, unspecified: Secondary | ICD-10-CM | POA: Diagnosis not present

## 2017-12-29 MED FILL — AZITHROMYCIN 250 MG TABLET: 250 | 5 days supply | Qty: 6 | Fill #0

## 2018-03-17 ENCOUNTER — Ambulatory Visit (INDEPENDENT_AMBULATORY_CARE_PROVIDER_SITE_OTHER): Payer: Medicare Other | Admitting: Podiatry

## 2018-03-17 ENCOUNTER — Ambulatory Visit (INDEPENDENT_AMBULATORY_CARE_PROVIDER_SITE_OTHER): Payer: Medicare Other

## 2018-03-17 ENCOUNTER — Encounter: Payer: Self-pay | Admitting: Podiatry

## 2018-03-17 ENCOUNTER — Other Ambulatory Visit: Payer: Self-pay | Admitting: Podiatry

## 2018-03-17 ENCOUNTER — Ambulatory Visit (INDEPENDENT_AMBULATORY_CARE_PROVIDER_SITE_OTHER): Payer: Self-pay | Admitting: Orthotics

## 2018-03-17 DIAGNOSIS — M779 Enthesopathy, unspecified: Secondary | ICD-10-CM | POA: Diagnosis not present

## 2018-03-17 DIAGNOSIS — M79671 Pain in right foot: Secondary | ICD-10-CM | POA: Diagnosis not present

## 2018-03-17 DIAGNOSIS — G5761 Lesion of plantar nerve, right lower limb: Secondary | ICD-10-CM | POA: Diagnosis not present

## 2018-03-17 DIAGNOSIS — M79672 Pain in left foot: Secondary | ICD-10-CM

## 2018-03-17 NOTE — Progress Notes (Signed)
Patient presents today with a hx of PTTD/AAF.  Upon assessment, patient has pronounced pes planus w/ a valgus RF deformity.  Patient has medially shifted talus/navicular.  Goal is provide longitudinal arch support and RF stability.  Plan on deep heel cup, hug arch, wide foot orthosis w/ medial flange and varus correction for RF valgus deformity.   Also Met bar placed strategically to take pressure off of Morton neuroma RT.  Patient educated in the progessive nature of PTTD and financial responsibility.

## 2018-03-18 NOTE — Progress Notes (Signed)
Subjective:   Patient ID: Dominique MeigsBarbara Cruz, female   DOB: 77 y.o.   MRN: [id number]   HPI Patient states that when she is very active she gets some discomfort in the right second and third metatarsal over left and she has severe flatfoot deformity with history of orthotics and needs a new pair made it is its been over 5 years.  Patient does not smoke and likes to be active   Review of Systems  All other systems reviewed and are negative.       Objective:  Physical Exam  Constitutional: She appears well-developed and well-nourished.  Cardiovascular: Intact distal pulses.  Pulmonary/Chest: Effort normal.  Musculoskeletal: Normal range of motion.  Neurological: She is alert.  Skin: Skin is warm.  Nursing note and vitals reviewed.   Neurovascular status intact muscle strength is adequate range of motion within normal limits with patient found to have severe flatfoot deformity with prominent metatarsals second third metatarsal right with inflammation and mild discomfort also in the interspace of the second right.  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  Do not recommend forefoot treatment but we will try to offload the metatarsals with cushioning and she will be seen back for orthotic pickup or earlier if any issues should occur  X-rays indicate significant collapse of medial longitudinal arch bilateral with structural bunion deformity noted

## 2018-04-08 ENCOUNTER — Ambulatory Visit: Payer: Medicare Other | Admitting: Orthotics

## 2018-04-08 DIAGNOSIS — M779 Enthesopathy, unspecified: Secondary | ICD-10-CM

## 2018-04-08 DIAGNOSIS — M21969 Unspecified acquired deformity of unspecified lower leg: Secondary | ICD-10-CM

## 2018-04-08 DIAGNOSIS — M2141 Flat foot [pes planus] (acquired), right foot: Secondary | ICD-10-CM

## 2018-04-08 DIAGNOSIS — M79672 Pain in left foot: Secondary | ICD-10-CM

## 2018-04-08 DIAGNOSIS — G5761 Lesion of plantar nerve, right lower limb: Secondary | ICD-10-CM

## 2018-04-08 DIAGNOSIS — M79671 Pain in right foot: Secondary | ICD-10-CM

## 2018-04-08 DIAGNOSIS — M2142 Flat foot [pes planus] (acquired), left foot: Secondary | ICD-10-CM

## 2018-04-08 NOTE — Progress Notes (Signed)
Patient didn't wear appropriate footwear and will reschedule.

## 2018-04-13 ENCOUNTER — Ambulatory Visit: Payer: Medicare Other | Admitting: Orthotics

## 2018-04-13 DIAGNOSIS — M79672 Pain in left foot: Secondary | ICD-10-CM

## 2018-04-13 DIAGNOSIS — G5761 Lesion of plantar nerve, right lower limb: Secondary | ICD-10-CM

## 2018-04-13 DIAGNOSIS — M21969 Unspecified acquired deformity of unspecified lower leg: Secondary | ICD-10-CM

## 2018-04-13 DIAGNOSIS — M79671 Pain in right foot: Secondary | ICD-10-CM

## 2018-04-13 NOTE — Progress Notes (Signed)
Patient came in today to pick up custom made foot orthotics.  The goals were accomplished and the patient reported no dissatisfaction with said orthotics.  Patient was advised of breakin period and how to report any issues. 

## 2018-04-20 ENCOUNTER — Ambulatory Visit: Payer: Medicare Other | Admitting: Orthotics

## 2018-04-20 DIAGNOSIS — G5761 Lesion of plantar nerve, right lower limb: Secondary | ICD-10-CM

## 2018-04-20 DIAGNOSIS — M2142 Flat foot [pes planus] (acquired), left foot: Secondary | ICD-10-CM

## 2018-04-20 DIAGNOSIS — M79671 Pain in right foot: Secondary | ICD-10-CM

## 2018-04-20 DIAGNOSIS — M2141 Flat foot [pes planus] (acquired), right foot: Secondary | ICD-10-CM

## 2018-04-20 DIAGNOSIS — M79672 Pain in left foot: Secondary | ICD-10-CM

## 2018-04-20 DIAGNOSIS — M21969 Unspecified acquired deformity of unspecified lower leg: Secondary | ICD-10-CM

## 2018-04-20 NOTE — Progress Notes (Signed)
Glued topcover down; patient well pleased with result.

## 2018-05-22 DIAGNOSIS — Z23 Encounter for immunization: Secondary | ICD-10-CM | POA: Diagnosis not present

## 2018-06-16 DIAGNOSIS — Z1389 Encounter for screening for other disorder: Secondary | ICD-10-CM | POA: Diagnosis not present

## 2018-06-16 DIAGNOSIS — Z23 Encounter for immunization: Secondary | ICD-10-CM | POA: Diagnosis not present

## 2018-06-16 DIAGNOSIS — E7800 Pure hypercholesterolemia, unspecified: Secondary | ICD-10-CM | POA: Diagnosis not present

## 2018-06-16 DIAGNOSIS — M48061 Spinal stenosis, lumbar region without neurogenic claudication: Secondary | ICD-10-CM | POA: Diagnosis not present

## 2018-06-16 DIAGNOSIS — R32 Unspecified urinary incontinence: Secondary | ICD-10-CM | POA: Diagnosis not present

## 2018-06-16 DIAGNOSIS — R7309 Other abnormal glucose: Secondary | ICD-10-CM | POA: Diagnosis not present

## 2018-06-16 DIAGNOSIS — Z0000 Encounter for general adult medical examination without abnormal findings: Secondary | ICD-10-CM | POA: Diagnosis not present

## 2018-06-16 DIAGNOSIS — I10 Essential (primary) hypertension: Secondary | ICD-10-CM | POA: Diagnosis not present

## 2018-06-16 DIAGNOSIS — Z86711 Personal history of pulmonary embolism: Secondary | ICD-10-CM | POA: Diagnosis not present

## 2018-06-16 DIAGNOSIS — K219 Gastro-esophageal reflux disease without esophagitis: Secondary | ICD-10-CM | POA: Diagnosis not present

## 2018-06-16 DIAGNOSIS — M8588 Other specified disorders of bone density and structure, other site: Secondary | ICD-10-CM | POA: Diagnosis not present

## 2018-06-16 DIAGNOSIS — J309 Allergic rhinitis, unspecified: Secondary | ICD-10-CM | POA: Diagnosis not present

## 2018-06-16 DIAGNOSIS — I700 Atherosclerosis of aorta: Secondary | ICD-10-CM | POA: Diagnosis not present

## 2018-06-22 DIAGNOSIS — H26493 Other secondary cataract, bilateral: Secondary | ICD-10-CM | POA: Diagnosis not present

## 2018-06-22 DIAGNOSIS — H35033 Hypertensive retinopathy, bilateral: Secondary | ICD-10-CM | POA: Diagnosis not present

## 2018-07-14 DIAGNOSIS — Z6828 Body mass index (BMI) 28.0-28.9, adult: Secondary | ICD-10-CM | POA: Diagnosis not present

## 2018-07-14 DIAGNOSIS — Z124 Encounter for screening for malignant neoplasm of cervix: Secondary | ICD-10-CM | POA: Diagnosis not present

## 2018-07-14 DIAGNOSIS — Z1231 Encounter for screening mammogram for malignant neoplasm of breast: Secondary | ICD-10-CM | POA: Diagnosis not present

## 2018-09-08 DIAGNOSIS — I10 Essential (primary) hypertension: Secondary | ICD-10-CM | POA: Diagnosis not present

## 2018-09-08 DIAGNOSIS — R32 Unspecified urinary incontinence: Secondary | ICD-10-CM | POA: Diagnosis not present

## 2018-09-08 DIAGNOSIS — K219 Gastro-esophageal reflux disease without esophagitis: Secondary | ICD-10-CM | POA: Diagnosis not present

## 2018-09-08 MED FILL — OXYBUTYNIN CHLORIDE 5 MG TA: 5 | 90 days supply | Qty: 45 | Fill #0

## 2018-11-01 ENCOUNTER — Ambulatory Visit (INDEPENDENT_AMBULATORY_CARE_PROVIDER_SITE_OTHER): Payer: Medicare Other | Admitting: Family Medicine

## 2018-11-01 ENCOUNTER — Other Ambulatory Visit: Payer: Self-pay

## 2018-11-01 ENCOUNTER — Encounter: Payer: Self-pay | Admitting: Family Medicine

## 2018-11-01 VITALS — BP 167/85 | HR 64 | Temp 97.7°F | Ht 62.0 in | Wt 147.0 lb

## 2018-11-01 DIAGNOSIS — M7521 Bicipital tendinitis, right shoulder: Secondary | ICD-10-CM

## 2018-11-01 DIAGNOSIS — M19011 Primary osteoarthritis, right shoulder: Secondary | ICD-10-CM

## 2018-11-01 DIAGNOSIS — M67911 Unspecified disorder of synovium and tendon, right shoulder: Secondary | ICD-10-CM

## 2018-11-01 DIAGNOSIS — M75101 Unspecified rotator cuff tear or rupture of right shoulder, not specified as traumatic: Secondary | ICD-10-CM | POA: Diagnosis not present

## 2018-11-01 MED ORDER — MELOXICAM 15 MG PO TABS: 15.0000 mg | ORAL_TABLET | Freq: Every day | ORAL | 1 refills | Status: DC

## 2018-11-01 MED ORDER — NITROGLYCERIN 0.2 MG/HR TD PT24: MEDICATED_PATCH | TRANSDERMAL | 1 refills | Status: DC

## 2018-11-01 MED FILL — NITROGLYCERIN 0.2 MG/HR PTC: 0.2 | 88 days supply | Qty: 22 | Fill #0

## 2018-11-01 MED FILL — MELOXICAM 15 MG TABLET: 15 | 30 days supply | Qty: 30 | Fill #0

## 2018-11-01 NOTE — Progress Notes (Signed)
PCP: Georgann Housekeeper, MD  Subjective:   HPI: Patient is a 77 y.o. female here for right arm pain.  Patient presents with right arm pain.  She localizes her pain to the proximal mid humerus of the right arm.  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  It has been significantly worse and sharp over the past 1 to 2 weeks.  It radiates into the shoulder.  She denies any radiation down into the hand.  Pain is worse with trying to abduct or flex at the shoulder.  Worse with trying to lift objects.  Pain is improved with rest.  She has tried Tylenol, CBD, Biofreeze, and Aspercreme which have not helped.  No swelling, erythema, bruising or skin changes associated.  Past Medical History:  Diagnosis Date  . Anemia   . GERD (gastroesophageal reflux disease)   . History of chicken pox   . Hypertension   . Mumps   . Overactive bladder   . PE (pulmonary embolism)    2012  . Poison ivy    both arms healing well on prednisone   . Scoliosis   . Swelling    FEET AND LEGS B/L  . Weakness of left leg     Current Outpatient Medications on File Prior to Visit  Medication Sig Dispense Refill  . acetaminophen (TYLENOL) 650 MG CR tablet Take 650 mg by mouth every 8 (eight) hours as needed for pain.    Marland Kitchen ARTIFICIAL TEAR OP Place 1 drop into both eyes 2 (two) times daily.    Marland Kitchen aspirin 81 MG tablet Take 81 mg by mouth daily.    . Calcium Carbonate-Vitamin D (CALTRATE 600+D) 600-400 MG-UNIT per tablet Take 1 tablet by mouth daily.     . Coenzyme Q10 (CO Q 10) 100 MG CAPS Take 100 mg by mouth daily.    . diphenhydrAMINE (BENADRYL) 25 mg capsule Take 25 mg by mouth every 6 (six) hours as needed for itching.    . Ferrous Sulfate (SLOW FE PO) Take 1 tablet by mouth daily.    . fish oil-omega-3 fatty acids 1000 MG capsule Take 1 g by mouth daily.      . fluticasone (FLONASE) 50 MCG/ACT nasal spray Place 2 sprays into both nostrils daily as needed for  allergies.     . magnesium oxide (MAG-OX) 400 MG tablet Take 400 mg by mouth daily.      . Multiple Vitamin (MULTIVITAMIN) tablet Take 1 tablet by mouth daily.      Marland Kitchen oxybutynin (DITROPAN) 5 MG tablet Take 2.5 mg by mouth 2 (two) times daily.    . pantoprazole (PROTONIX) 20 MG tablet Take 20 mg by mouth daily.    . Potassium 99 MG TABS Take 1 tablet by mouth daily.      . valsartan (DIOVAN) 40 MG tablet Take 40 mg by mouth daily.    . vitamin B-12 (CYANOCOBALAMIN) 1000 MCG tablet Take 1,000 mcg by mouth daily.      . vitamin E 400 UNIT capsule Take 400 Units by mouth daily.       No current facility-administered medications on file prior to visit.     Past Surgical History:  Procedure Laterality Date  . COLONOSCOPY WITH PROPOFOL N/A 11/24/2016   Procedure: COLONOSCOPY WITH PROPOFOL;  Surgeon: Charolett Bumpers, MD;  Location: [facility];  Service: Endoscopy;  Laterality: N/A;  . FOOT SURGERY Bilateral  BUNION  . FOOT SURGERY Right    TENDON right ankle  . KNEE SURGERY Right    arthrscopy and meniscus repair    Allergies  Allergen Reactions  . Ao-Disc Catalyst     Eye swelled shut  . Sulfa Drugs Cross Reactors Other (See Comments)    unknown    Social History   Socioeconomic History  . Marital status: Married    Spouse name: Not on file  . Number of children: 2  . Years of education: Not on file  . Highest education level: Not on file  Occupational History  . Occupation: Retired  Engineer, productionocial Needs  . Financial resource strain: Not on file  . Food insecurity:    Worry: Not on file    Inability: Not on file  . Transportation needs:    Medical: Not on file    Non-medical: Not on file  Tobacco Use  . Smoking status: Former Smoker    Types: Cigarettes    Last attempt to quit: 05/11/1961    Years since quitting: 57.5  . Smokeless tobacco: Never Used  . Tobacco comment: smoked few years  Substance and Sexual Activity  . Alcohol use: Yes    Alcohol/week: 0.0 standard  drinks    Comment: weekly  . Drug use: No  . Sexual activity: Not on file  Lifestyle  . Physical activity:    Days per week: Not on file    Minutes per session: Not on file  . Stress: Not on file  Relationships  . Social connections:    Talks on phone: Not on file    Gets together: Not on file    Attends religious service: Not on file    Active member of club or organization: Not on file    Attends meetings of clubs or organizations: Not on file    Relationship status: Not on file  . Intimate partner violence:    Fear of current or ex partner: Not on file    Emotionally abused: Not on file    Physically abused: Not on file    Forced sexual activity: Not on file  Other Topics Concern  . Not on file  Social History Narrative  . Not on file    Family History  Problem Relation Age of Onset  . Arthritis Father   . Heart disease Father   . Allergies Father   . Colon cancer Mother   . Heart disease Mother   . Mental illness Mother   . Allergies Mother   . Multiple sclerosis Brother   . Prostate cancer Brother   . Stroke Brother   . Healthy Son        x1  . Healthy Daughter        x1    There were no vitals taken for this visit.  Review of Systems: See HPI above.     Objective:  Physical Exam:  Gen: awake, alert, NAD, comfortable in exam room Pulm: breathing unlabored  Right shoulder: No obvious deformity or asymmetry. No bruising. No swelling Diffuse tenderness around the anterior and lateral mid to proximal humerus Limited flexion and abduction at the shoulder due to pain NV intact distally Special Tests:  - Impingement: Neg Hawkins and Neers.  - Supraspinatus: Pain with empty can.  4/5 strength - Infraspinatus/Teres: 4+/5 strength with ER - Subscapularis. 4+/5 strength with IR - Biceps tendon: Positive speeds.  Positive Yergason  MSK US: US right shoulder  BT short:  Biceps tendon  is significantly thickened and there is evidence of a split tear.   There is also significant tenosynovitis BT long:  Biceps tendon is thickened with tenosynovitis Supraspinatus tendon:  There is a full-thickness tear of the anterior supraspinatus with retraction.  The posterior supraspinatus is intact Subscapularis tendon:  Mild chronic degenerative changes without tears Infraspinatus tendon: Mild chronic degenerative changes without tears.  There is small calcification seen at the insertion [facility] joint: Diffuse calcifications within the posterior labrum without focal tear AC joint:  Moderate degenerative changes, positive geyser sign  Summary and Additional findings- 1.  Biceps tendinopathy with split tear and tenosynovitis 2.  Full-thickness tear of the anterior supraspinatus with retraction 3.  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 4.  AC joint arthritis  Left shoulder: No obvious deformity or asymmetry. No bruising. No swelling No TTP Full ROM in flexion, abduction, internal/external rotation NV intact distally Special Tests:  - Impingement: Neg Hawkins and Neers.  - Supraspinatus: Negative empty can.  5/5 strength - Infraspinatus/Teres: 5/5 strength with ER - Subscapularis: 5/5 strength with IR - Biceps tendon: Negative Speeds.    Assessment & Plan:  1.  Right shoulder pain 2/2 Full-thickness supraspinatus tear and Severe Biceps tendinopathy/tenosynovitis. - Nitroglycerin protocol - Ice - Meloxicam 15 mg daily - We will start with home rehab exercises - Follow-up in 6 weeks

## 2018-11-01 NOTE — Patient Instructions (Addendum)
You have severe biceps tendinopathy/tenosynovitis and an old rotator cuff tear with tendinitis. Icing 15 minutes at a time 3-4 times a day. Ok to continue your acetaminophen as needed. Meloxicam 15mg  daily with food for pain and inflammation Nitro patches 1/4th patch to affected shoulder, change daily. Wait about a week then start yellow theraband strengthening exercises 3 sets of 10 once a day plus hammer rotations and biceps curls (you can start with fewer exercises and build your way up). Follow up or call me in about 5-6 weeks to let me know how you're doing, sooner if you're struggling. I'd like to avoid an injection if we can.

## 2018-11-02 ENCOUNTER — Encounter: Payer: Self-pay | Admitting: Family Medicine

## 2018-11-13 MED FILL — OXYBUTYNIN CHLORIDE 5 MG TA: 5 | 90 days supply | Qty: 45 | Fill #1

## 2019-01-31 MED FILL — OXYBUTYNIN CHLORIDE 5 MG TA: 5 | 90 days supply | Qty: 45 | Fill #2

## 2019-03-31 DIAGNOSIS — Z23 Encounter for immunization: Secondary | ICD-10-CM | POA: Diagnosis not present

## 2019-06-20 DIAGNOSIS — H35033 Hypertensive retinopathy, bilateral: Secondary | ICD-10-CM | POA: Diagnosis not present

## 2019-06-20 DIAGNOSIS — H524 Presbyopia: Secondary | ICD-10-CM | POA: Diagnosis not present

## 2019-06-20 DIAGNOSIS — H5203 Hypermetropia, bilateral: Secondary | ICD-10-CM | POA: Diagnosis not present

## 2019-06-20 DIAGNOSIS — H26493 Other secondary cataract, bilateral: Secondary | ICD-10-CM | POA: Diagnosis not present

## 2019-06-20 DIAGNOSIS — H52223 Regular astigmatism, bilateral: Secondary | ICD-10-CM | POA: Diagnosis not present

## 2019-07-04 DIAGNOSIS — M8588 Other specified disorders of bone density and structure, other site: Secondary | ICD-10-CM | POA: Diagnosis not present

## 2019-07-04 DIAGNOSIS — R7309 Other abnormal glucose: Secondary | ICD-10-CM | POA: Diagnosis not present

## 2019-07-04 DIAGNOSIS — K219 Gastro-esophageal reflux disease without esophagitis: Secondary | ICD-10-CM | POA: Diagnosis not present

## 2019-07-04 DIAGNOSIS — J309 Allergic rhinitis, unspecified: Secondary | ICD-10-CM | POA: Diagnosis not present

## 2019-07-04 DIAGNOSIS — E7800 Pure hypercholesterolemia, unspecified: Secondary | ICD-10-CM | POA: Diagnosis not present

## 2019-07-04 DIAGNOSIS — R011 Cardiac murmur, unspecified: Secondary | ICD-10-CM | POA: Diagnosis not present

## 2019-07-04 DIAGNOSIS — R32 Unspecified urinary incontinence: Secondary | ICD-10-CM | POA: Diagnosis not present

## 2019-07-04 DIAGNOSIS — Z0000 Encounter for general adult medical examination without abnormal findings: Secondary | ICD-10-CM | POA: Diagnosis not present

## 2019-07-04 DIAGNOSIS — Z1389 Encounter for screening for other disorder: Secondary | ICD-10-CM | POA: Diagnosis not present

## 2019-07-04 DIAGNOSIS — I10 Essential (primary) hypertension: Secondary | ICD-10-CM | POA: Diagnosis not present

## 2019-07-04 DIAGNOSIS — I700 Atherosclerosis of aorta: Secondary | ICD-10-CM | POA: Diagnosis not present

## 2019-07-06 ENCOUNTER — Other Ambulatory Visit (HOSPITAL_COMMUNITY): Payer: Self-pay | Admitting: Internal Medicine

## 2019-07-06 DIAGNOSIS — R011 Cardiac murmur, unspecified: Secondary | ICD-10-CM

## 2019-07-14 ENCOUNTER — Ambulatory Visit (HOSPITAL_COMMUNITY): Payer: Medicare Other | Attending: Cardiology

## 2019-07-14 ENCOUNTER — Other Ambulatory Visit: Payer: Self-pay

## 2019-07-14 DIAGNOSIS — R011 Cardiac murmur, unspecified: Secondary | ICD-10-CM

## 2019-09-07 NOTE — Progress Notes (Signed)
Referring-Karrar Eula Listen MD Reason for referral-mitral regurgitation  HPI: 79 year old female for evaluation of mitral regurgitation at request of Tyson Dense MD.  Echocardiogram December 2020 showed normal LV function, eccentric anteriorly directed moderate to severe mitral regurgitation, mild left atrial enlargement.  Laboratories November 2020 showed total cholesterol 207 with LDL of 109 and HDL 85.  BUN 16, creatinine 0.59.  Patient denies dyspnea on exertion, orthopnea, PND, pedal edema, chest pain, palpitations or syncope.  Current Outpatient Medications  Medication Sig Dispense Refill  . acetaminophen (TYLENOL) 650 MG CR tablet Take 650 mg by mouth every 8 (eight) hours as needed for pain.    Marland Kitchen ARTIFICIAL TEAR OP Place 1 drop into both eyes 2 (two) times daily.    . Ascorbic Acid (VITAMIN C PO) Take by mouth as directed.     Marland Kitchen aspirin 81 MG tablet Take 81 mg by mouth daily.    . B Complex-C (SUPER B COMPLEX PO) Take by mouth as directed.     Marland Kitchen BIOTIN PO Take 1 capsule by mouth as directed.     . Calcium Carbonate-Vitamin D (CALTRATE 600+D) 600-400 MG-UNIT per tablet Take 1 tablet by mouth daily.     . Coenzyme Q10 (CO Q 10) 100 MG CAPS Take 100 mg by mouth daily.    . diphenhydrAMINE (BENADRYL) 25 mg capsule Take 25 mg by mouth every 6 (six) hours as needed for itching.    . Ferrous Sulfate (SLOW FE PO) Take 1 tablet by mouth daily.    . fish oil-omega-3 fatty acids 1000 MG capsule Take 1 g by mouth daily.      . fluticasone (FLONASE) 50 MCG/ACT nasal spray Place 2 sprays into both nostrils daily as needed for allergies.     Marland Kitchen GLUCOSAMINE-CHONDROITIN PO Take by mouth as directed.    . INOSITOL-5 PO Take by mouth as directed.     Marland Kitchen LYSINE PO Take by mouth as directed.     . magnesium oxide (MAG-OX) 400 MG tablet Take 400 mg by mouth daily.      Marland Kitchen MAGNESIUM PO Take by mouth as directed.     Marland Kitchen MELATONIN PO Take 6 mg by mouth as directed.    . meloxicam (MOBIC) 15 MG tablet Take  1 tablet (15 mg total) by mouth daily. 30 tablet 1  . Misc Natural Products (TART CHERRY ADVANCED PO) Take by mouth as directed.     . Multiple Vitamin (MULTIVITAMIN) tablet Take 1 tablet by mouth daily.      . Multiple Vitamins-Minerals (ZINC PO) Take by mouth.    . nitroGLYCERIN (NITRODUR - DOSED IN MG/24 HR) 0.2 mg/hr patch Apply 1/4th patch to affected area, change daily 30 patch 1  . olmesartan (BENICAR) 20 MG tablet     . oxybutynin (DITROPAN) 5 MG tablet Take 2.5 mg by mouth 2 (two) times daily.    . pantoprazole (PROTONIX) 20 MG tablet Take 20 mg by mouth daily.    . Potassium 99 MG TABS Take 1 tablet by mouth daily.      . TURMERIC PO Take by mouth as directed.    . valsartan (DIOVAN) 40 MG tablet Take 40 mg by mouth daily.    . vitamin B-12 (CYANOCOBALAMIN) 1000 MCG tablet Take 1,000 mcg by mouth daily.      . vitamin E 400 UNIT capsule Take 400 Units by mouth daily.       No current facility-administered medications for this visit.  Allergies  Allergen Reactions  . Ao-Disc Catalyst     Eye swelled shut  . Sulfa Drugs Cross Reactors Other (See Comments)    unknown     Past Medical History:  Diagnosis Date  . Anemia   . GERD (gastroesophageal reflux disease)   . History of chicken pox   . Hypertension   . Mumps   . Overactive bladder   . PE (pulmonary embolism)    2012  . Poison ivy    both arms healing well on prednisone   . Scoliosis   . Swelling    FEET AND LEGS B/L  . Weakness of left leg     Past Surgical History:  Procedure Laterality Date  . COLONOSCOPY WITH PROPOFOL N/A 11/24/2016   Procedure: COLONOSCOPY WITH PROPOFOL;  Surgeon: Garlan Fair, MD;  Location: [facility];  Service: Endoscopy;  Laterality: N/A;  . FOOT SURGERY Bilateral    BUNION  . FOOT SURGERY Right    TENDON right ankle  . KNEE SURGERY Right    arthrscopy and meniscus repair    Social History   Socioeconomic History  . Marital status: Married    Spouse name: Not on  file  . Number of children: 2  . Years of education: Not on file  . Highest education level: Not on file  Occupational History  . Occupation: Retired  Tobacco Use  . Smoking status: Former Smoker    Types: Cigarettes    Quit date: 05/11/1961    Years since quitting: 58.3  . Smokeless tobacco: Never Used  . Tobacco comment: smoked few years  Substance and Sexual Activity  . Alcohol use: Yes    Alcohol/week: 0.0 standard drinks    Comment: weekly  . Drug use: No  . Sexual activity: Not on file  Other Topics Concern  . Not on file  Social History Narrative  . Not on file   Social Determinants of Health   Financial Resource Strain:   . Difficulty of Paying Living Expenses: Not on file  Food Insecurity:   . Worried About Charity fundraiser in the Last Year: Not on file  . Ran Out of Food in the Last Year: Not on file  Transportation Needs:   . Lack of Transportation (Medical): Not on file  . Lack of Transportation (Non-Medical): Not on file  Physical Activity:   . Days of Exercise per Week: Not on file  . Minutes of Exercise per Session: Not on file  Stress:   . Feeling of Stress : Not on file  Social Connections:   . Frequency of Communication with Friends and Family: Not on file  . Frequency of Social Gatherings with Friends and Family: Not on file  . Attends Religious Services: Not on file  . Active Member of Clubs or Organizations: Not on file  . Attends Archivist Meetings: Not on file  . Marital Status: Not on file  Intimate Partner Violence:   . Fear of Current or Ex-Partner: Not on file  . Emotionally Abused: Not on file  . Physically Abused: Not on file  . Sexually Abused: Not on file    Family History  Problem Relation Age of Onset  . Arthritis Father   . Heart disease Father   . Allergies Father   . Colon cancer Mother   . Heart disease Mother   . Mental illness Mother   . Allergies Mother   . Multiple sclerosis Brother   . Prostate  cancer Brother   . Stroke Brother   . Healthy Son        x1  . Healthy Daughter        x1    ROS: no fevers or chills, productive cough, hemoptysis, dysphasia, odynophagia, melena, hematochezia, dysuria, hematuria, rash, seizure activity, orthopnea, PND, pedal edema, claudication. Remaining systems are negative.  Physical Exam:   Blood pressure 116/72, pulse 60, height 5\' 2"  (1.575 m), weight 147 lb (66.7 kg).  General:  Well developed/well nourished in NAD Skin warm/dry Patient not depressed No peripheral clubbing Back-normal HEENT-normal/normal eyelids Neck supple/normal carotid upstroke bilaterally; no bruits; no JVD; no thyromegaly chest - CTA/ normal expansion CV - RRR/normal S1 and S2; no rubs or gallops;  PMI nondisplaced; 3/6 systolic murmur apex. Abdomen -NT/ND, no HSM, no mass, + bowel sounds, no bruit 2+ femoral pulses, no bruits Ext-no edema, chords, 2+ DP Neuro-grossly nonfocal  ECG -sinus rhythm at a rate of 60, no ST changes.  Personally reviewed  A/P  1 mitral regurgitation-I have personally reviewed the patient's transthoracic echocardiogram.  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  However it is clearly moderate to severe.  We will arrange a transesophageal echocardiogram to better assess severity of mitral regurgitation and valve morphology.  I had a long discussion with patient today concerning options.  If she indeed has severe mitral regurgitation one option would be to follow for symptoms and do serial echocardiograms.  Would proceed with mitral valve repair if she develops symptoms, LV dysfunction or left ventricular enlargement.  Alternatively we could consider early repair to avoid the possible complications associated with this including atrial fibrillation and LV dysfunction.  We will discuss this again after her transesophageal echocardiogram.  2 hypertension-blood  pressure controlled.  Continue present medications.  3 history of pulmonary embolus-occurred in the setting of travel.  , MD

## 2019-09-14 ENCOUNTER — Encounter: Payer: Self-pay | Admitting: Cardiology

## 2019-09-14 ENCOUNTER — Other Ambulatory Visit: Payer: Self-pay

## 2019-09-14 ENCOUNTER — Other Ambulatory Visit: Payer: Self-pay | Admitting: *Deleted

## 2019-09-14 ENCOUNTER — Ambulatory Visit (INDEPENDENT_AMBULATORY_CARE_PROVIDER_SITE_OTHER): Payer: Medicare PPO | Admitting: Cardiology

## 2019-09-14 VITALS — BP 116/72 | HR 60 | Ht 62.0 in | Wt 147.0 lb

## 2019-09-14 DIAGNOSIS — I10 Essential (primary) hypertension: Secondary | ICD-10-CM | POA: Diagnosis not present

## 2019-09-14 DIAGNOSIS — I340 Nonrheumatic mitral (valve) insufficiency: Secondary | ICD-10-CM | POA: Diagnosis not present

## 2019-09-14 NOTE — Patient Instructions (Addendum)
You are scheduled for a TEE on Friday 09/30/19 with Dr. Jens Som.  Please arrive at the [facility] (Main Entrance A) at [facility]: [address] [place], [place] [address] at 7 AM. (1 hour prior to procedure unless lab work is needed; if lab work is needed arrive 1.5 hours ahead)  GO TO [address] Tuesday 09-27-19 @ 11:15 AM FOR COVID TESTING= PRE-PROCEDURE LINE  DIET: Nothing to eat or drink after midnight except a sip of water with medications (see medication instructions below)  Medication Instructions:  TAKE ALL MEDICATIONS WITH SIPS OF WATER  You must have a responsible person to drive you home and stay in the waiting area during your procedure. Failure to do so could result in cancellation.  Bring your insurance cards.  *Special Note: Every effort is made to have your procedure done on time. Occasionally there are emergencies that occur at the hospital that may cause delays. Please be patient if a delay does occur.   Your physician recommends that you schedule a follow-up appointment in: 2-4 WEEKS AFTER TEE

## 2019-09-22 IMAGING — US US PELVIS COMPLETE TRANSABD/TRANSVAG
1 series · 14 of 25 positions shown · non-contrast
Comparison: CT scan July 29, 2017

CLINICAL DATA: Right ovarian cyst seen on CT scan from July 29, 2017.

EXAM:
TRANSABDOMINAL AND TRANSVAGINAL ULTRASOUND OF PELVIS
TECHNIQUE: Both transabdominal and transvaginal ultrasound examinations of the
pelvis were performed. Transabdominal technique was performed for
global imaging of the pelvis including uterus, ovaries, adnexal
regions, and pelvic cul-de-sac. It was necessary to proceed with
endovaginal exam following the transabdominal exam to visualize the
ovaries and endometrium..

[Series 1: us pelvis complete transabd/transvag · 0.13mm/px · 14 of 81 slices shown]
[im 1/81]
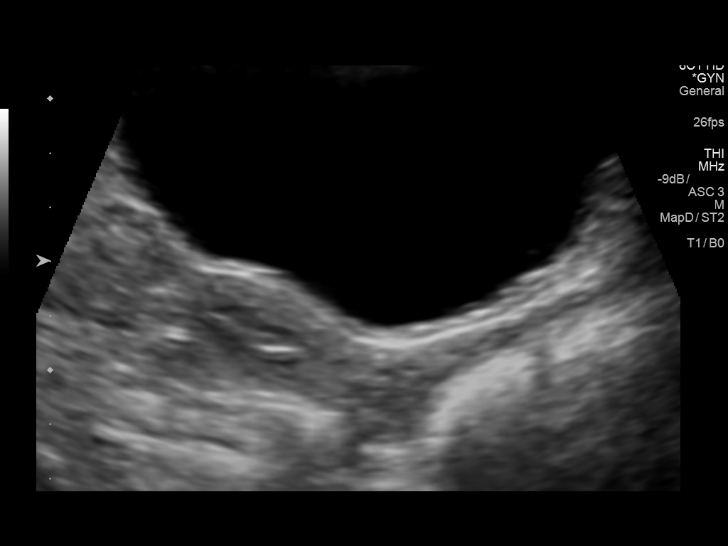
[im 7/81]
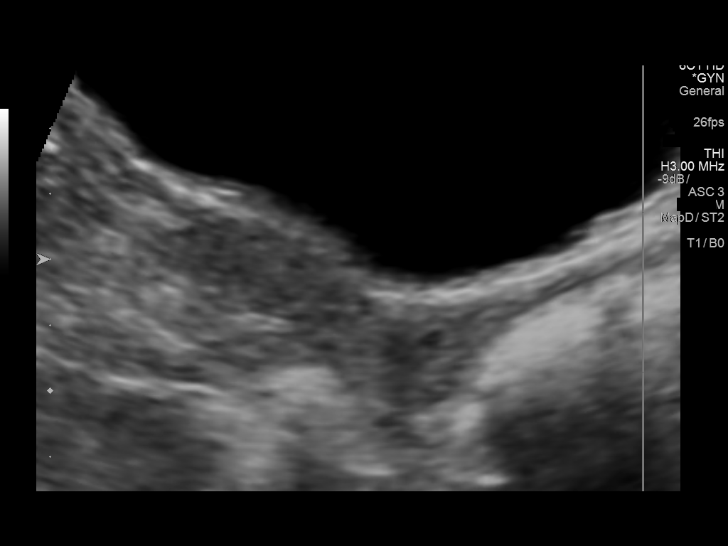
[im 14/81]
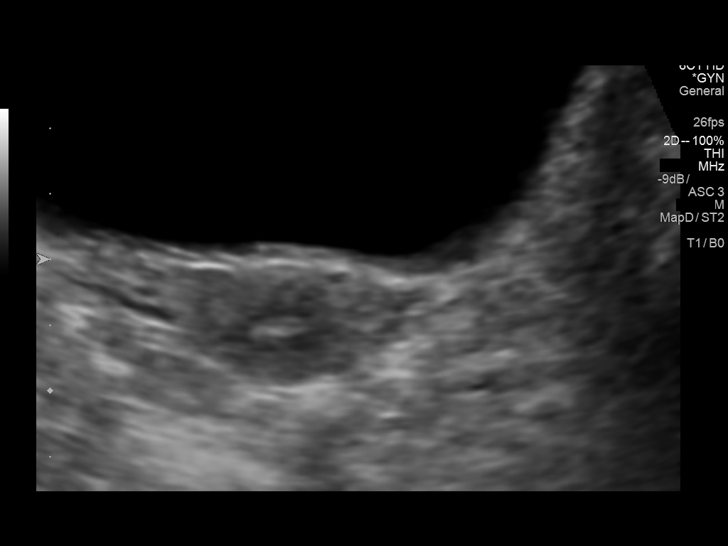
[im 21/81]
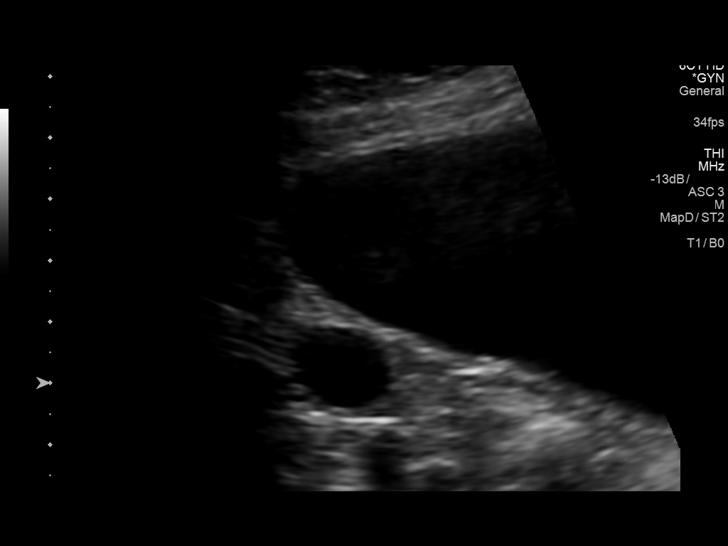
[im 27/81]
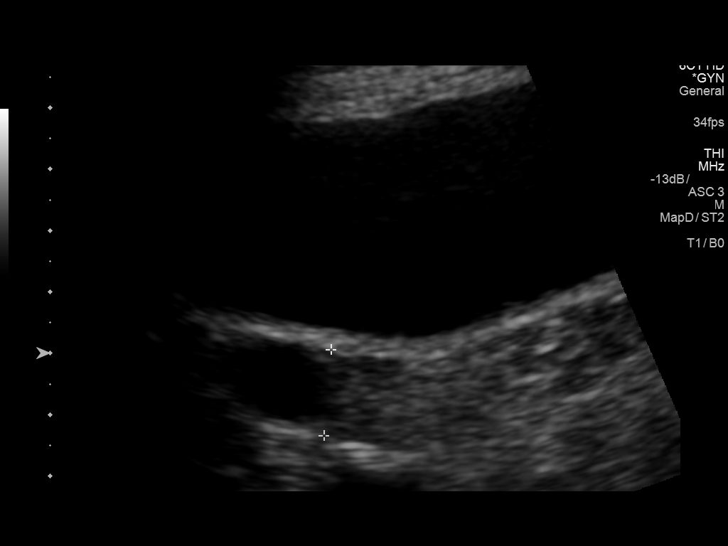
[im 31/81]
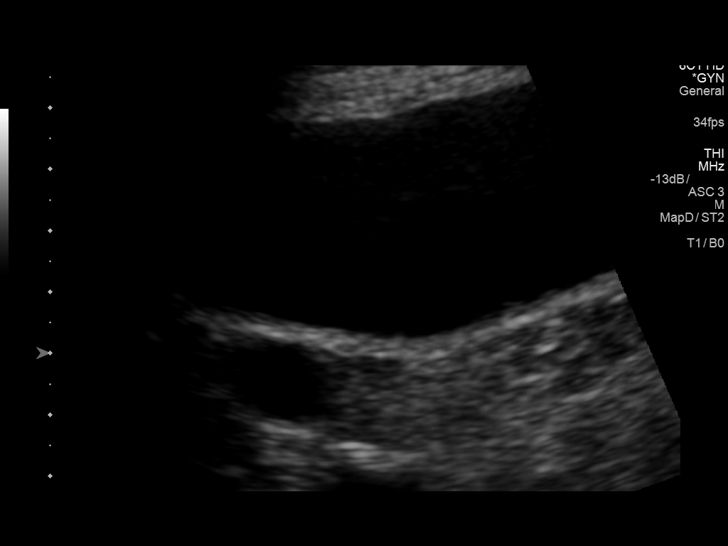
[im 37/81]
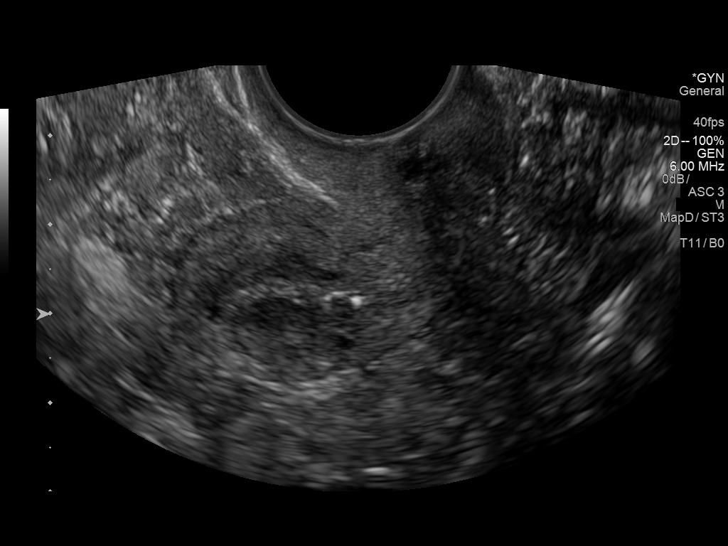
[im 44/81]
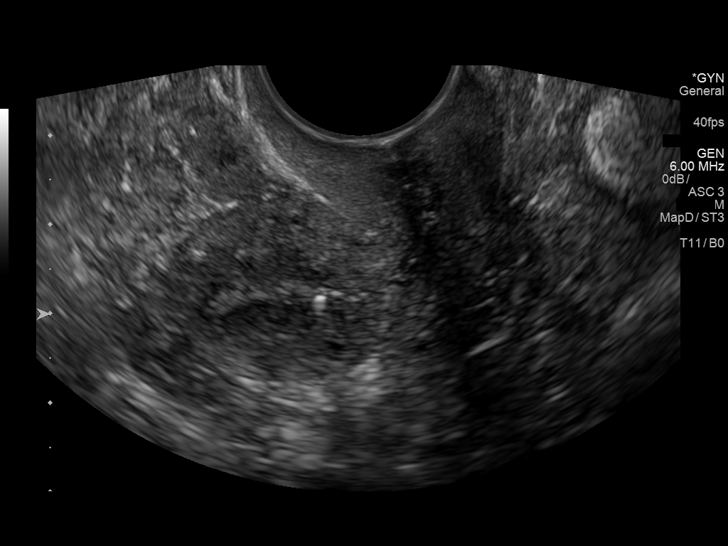
[im 51/81]
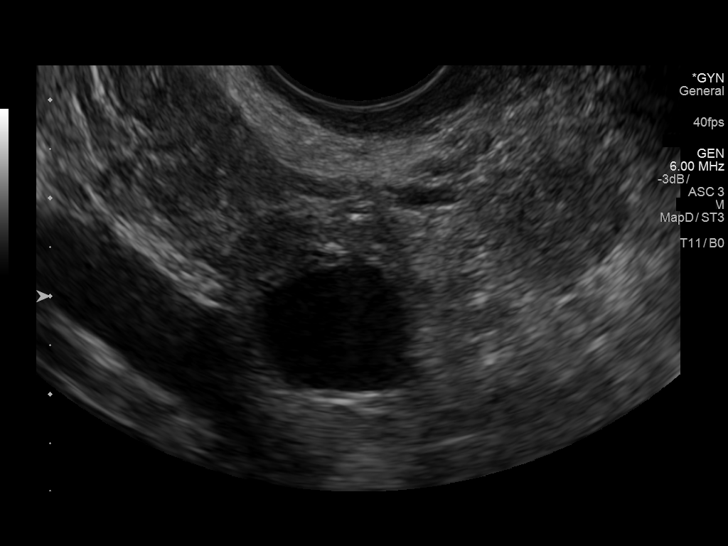
[im 54/81]
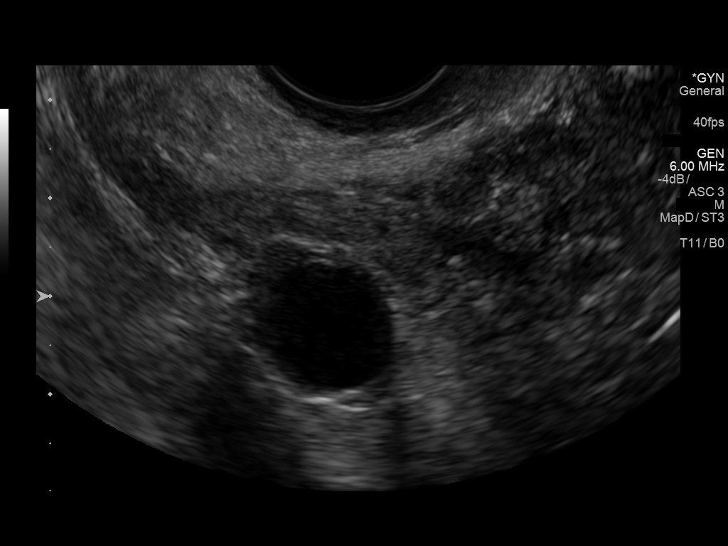
[im 61/81]
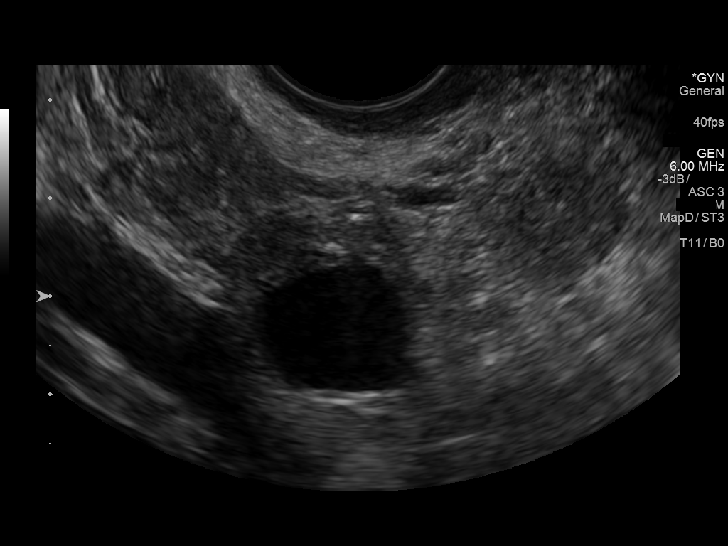
[im 67/81]
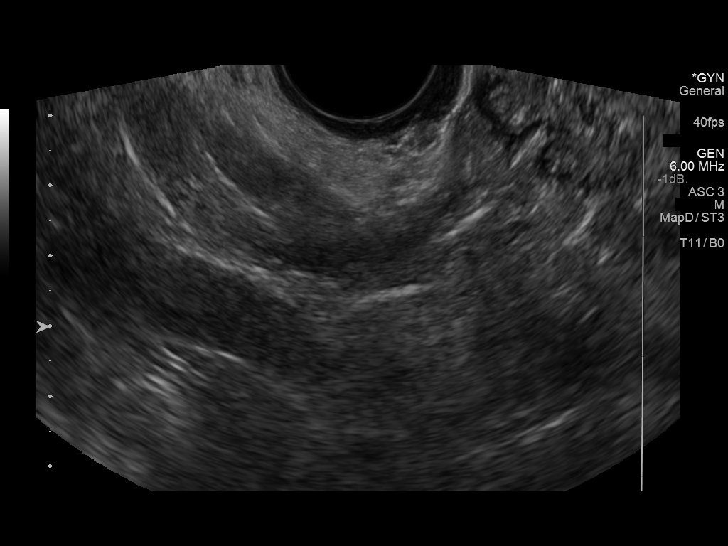
[im 74/81]
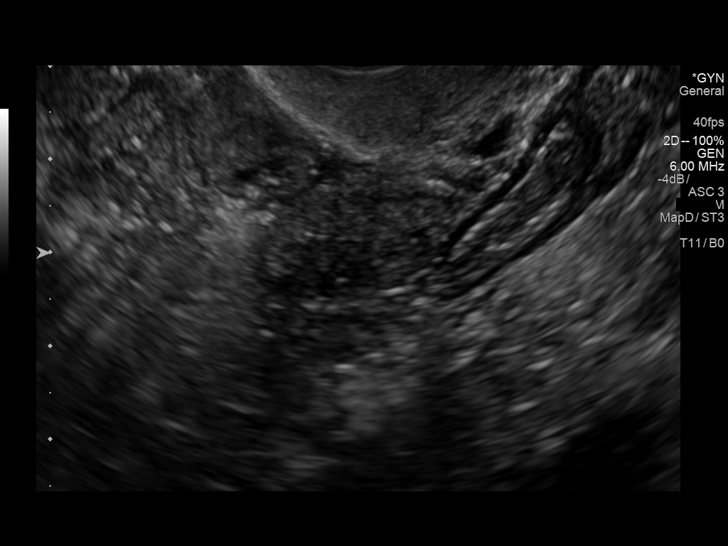
[im 81/81]
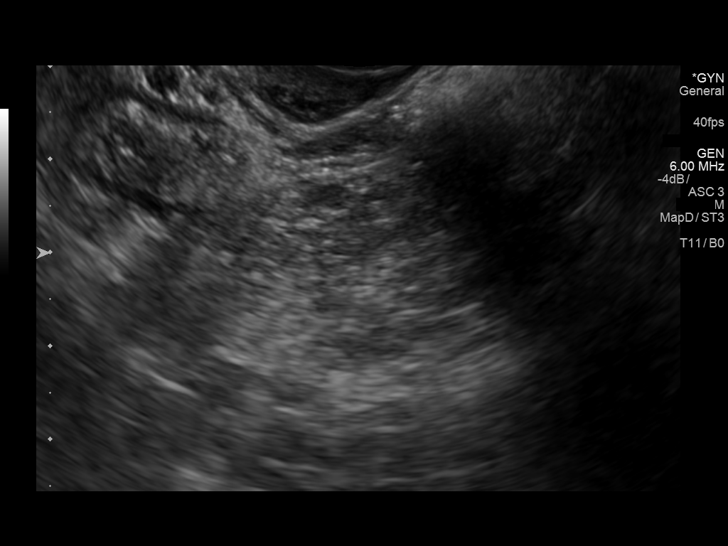

[14 of 25 positions shown; findings below may reference images not displayed]

FINDINGS: Uterus

Measurements: 5 x 2.1 x 3.1 cm. No fibroids or other mass
visualized.

Endometrium

Thickness: 2.2 mm.  No focal abnormality visualized.

Right ovary

Measurements: 2.4 x 1.8 x 1.4 cm. Contains a 1.6 x 1.3 x 1.3 cm
simple cyst.

Left ovary

Measurements: 2.8 x 1.3 x 1.8 cm. Normal appearance/no adnexal mass.

Other findings

No abnormal free fluid.
IMPRESSION: 1. There is a 1.6 cm simple cyst in the right ovary. Recommend a
follow-up ultrasound in 1 year for re-evaluation. This follows
consensus guidelines.

## 2019-09-27 ENCOUNTER — Other Ambulatory Visit (HOSPITAL_COMMUNITY)
Admission: RE | Admit: 2019-09-27 | Discharge: 2019-09-27 | Disposition: A | Discharge status: Home / Self Care | Payer: Medicare PPO | Source: Ambulatory Visit | Attending: Cardiology | Admitting: Cardiology

## 2019-09-27 DIAGNOSIS — Z01812 Encounter for preprocedural laboratory examination: Secondary | ICD-10-CM | POA: Diagnosis present

## 2019-09-27 DIAGNOSIS — Z20822 Contact with and (suspected) exposure to covid-19: Secondary | ICD-10-CM | POA: Insufficient documentation

## 2019-09-27 LAB — SARS CORONAVIRUS 2 (TAT 6-24 HRS): SARS Coronavirus 2: NEGATIVE

## 2019-09-29 NOTE — Anesthesia Preprocedure Evaluation (Addendum)
Anesthesia Evaluation  Patient identified by MRN, date of birth, ID band Patient awake    Reviewed: Allergy & Precautions, NPO status , Patient's Chart, lab work & pertinent test results  Airway Mallampati: II  TM Distance: >3 FB Neck ROM: Full    Dental no notable dental hx. (+) Teeth Intact, Dental Advisory Given   Pulmonary neg pulmonary ROS, former smoker,    Pulmonary exam normal breath sounds clear to auscultation       Cardiovascular hypertension, Pt. on medications Normal cardiovascular exam+ Valvular Problems/Murmurs MR  Rhythm:Regular Rate:Normal     Neuro/Psych  Headaches, negative psych ROS   GI/Hepatic Neg liver ROS, GERD  Medicated,  Endo/Other  negative endocrine ROS  Renal/GU negative Renal ROS     Musculoskeletal negative musculoskeletal ROS (+)   Abdominal   Peds  Hematology   Anesthesia Other Findings   Reproductive/Obstetrics                            Anesthesia Physical Anesthesia Plan  ASA: II  Anesthesia Plan: MAC   Post-op Pain Management:    Induction: Intravenous  PONV Risk Score and Plan: Treatment may vary due to age or medical condition  Airway Management Planned: Nasal Cannula and Natural Airway  Additional Equipment: None  Intra-op Plan:   Post-operative Plan:   Informed Consent: I have reviewed the patients History and Physical, chart, labs and discussed the procedure including the risks, benefits and alternatives for the proposed anesthesia with the patient or authorized representative who has indicated his/her understanding and acceptance.     Dental advisory given  Plan Discussed with: CRNA  Anesthesia Plan Comments:        Anesthesia Quick Evaluation

## 2019-09-30 ENCOUNTER — Ambulatory Visit (HOSPITAL_BASED_OUTPATIENT_CLINIC_OR_DEPARTMENT_OTHER): Payer: Medicare PPO

## 2019-09-30 ENCOUNTER — Ambulatory Visit (HOSPITAL_COMMUNITY): Payer: Medicare PPO | Admitting: Anesthesiology

## 2019-09-30 ENCOUNTER — Encounter (HOSPITAL_COMMUNITY): Payer: Self-pay | Admitting: Cardiology

## 2019-09-30 ENCOUNTER — Encounter (HOSPITAL_COMMUNITY)
Admission: RE | Disposition: A | Discharge status: Home / Self Care | Payer: Medicare PPO | Source: Home / Self Care | Attending: Cardiology

## 2019-09-30 ENCOUNTER — Other Ambulatory Visit: Payer: Self-pay

## 2019-09-30 ENCOUNTER — Ambulatory Visit (HOSPITAL_COMMUNITY)
Admission: RE | Admit: 2019-09-30 | Discharge: 2019-09-30 | Disposition: A | Discharge status: Home / Self Care | Payer: Medicare PPO | Attending: Cardiology | Admitting: Cardiology

## 2019-09-30 DIAGNOSIS — I340 Nonrheumatic mitral (valve) insufficiency: Secondary | ICD-10-CM | POA: Insufficient documentation

## 2019-09-30 DIAGNOSIS — Z882 Allergy status to sulfonamides status: Secondary | ICD-10-CM | POA: Diagnosis not present

## 2019-09-30 DIAGNOSIS — M419 Scoliosis, unspecified: Secondary | ICD-10-CM | POA: Insufficient documentation

## 2019-09-30 DIAGNOSIS — I420 Dilated cardiomyopathy: Secondary | ICD-10-CM | POA: Diagnosis not present

## 2019-09-30 DIAGNOSIS — Z86711 Personal history of pulmonary embolism: Secondary | ICD-10-CM | POA: Insufficient documentation

## 2019-09-30 DIAGNOSIS — I700 Atherosclerosis of aorta: Secondary | ICD-10-CM | POA: Insufficient documentation

## 2019-09-30 DIAGNOSIS — Z7982 Long term (current) use of aspirin: Secondary | ICD-10-CM | POA: Insufficient documentation

## 2019-09-30 DIAGNOSIS — Z87891 Personal history of nicotine dependence: Secondary | ICD-10-CM | POA: Insufficient documentation

## 2019-09-30 DIAGNOSIS — Z8249 Family history of ischemic heart disease and other diseases of the circulatory system: Secondary | ICD-10-CM | POA: Insufficient documentation

## 2019-09-30 DIAGNOSIS — I119 Hypertensive heart disease without heart failure: Secondary | ICD-10-CM | POA: Insufficient documentation

## 2019-09-30 DIAGNOSIS — Z791 Long term (current) use of non-steroidal anti-inflammatories (NSAID): Secondary | ICD-10-CM | POA: Diagnosis not present

## 2019-09-30 DIAGNOSIS — K219 Gastro-esophageal reflux disease without esophagitis: Secondary | ICD-10-CM | POA: Insufficient documentation

## 2019-09-30 DIAGNOSIS — Z79899 Other long term (current) drug therapy: Secondary | ICD-10-CM | POA: Insufficient documentation

## 2019-09-30 HISTORY — PX: TEE WITHOUT CARDIOVERSION: SHX5443

## 2019-09-30 SURGERY — ECHOCARDIOGRAM, TRANSESOPHAGEAL
Anesthesia: Monitor Anesthesia Care

## 2019-09-30 MED ORDER — PROPOFOL 10 MG/ML IV BOLUS
INTRAVENOUS | Status: DC | PRN
  Administered 2019-09-30 (×2): 20 mg via INTRAVENOUS

## 2019-09-30 MED ORDER — PROPOFOL 500 MG/50ML IV EMUL
INTRAVENOUS | Status: DC | PRN
  Administered 2019-09-30: 150 ug/kg/min via INTRAVENOUS

## 2019-09-30 MED ORDER — LACTATED RINGERS IV SOLN
INTRAVENOUS | Status: DC
  Administered 2019-09-30: 1000 mL via INTRAVENOUS

## 2019-09-30 MED ORDER — SODIUM CHLORIDE 0.9 % IV SOLN: INTRAVENOUS | Status: DC

## 2019-09-30 NOTE — Progress Notes (Signed)
    Transesophageal Echocardiogram Note  Dominique Cruz [id number] 1941/04/25  Procedure: Transesophageal Echocardiogram Indications: Mitral Regurgitation  Procedure Details Consent: Obtained Time Out: Verified patient identification, verified procedure, site/side was marked, verified correct patient position, special equipment/implants available, Radiology Safety Procedures followed,  medications/allergies/relevent history reviewed, required imaging and test results available.  Performed  Medications:  Pt sedated by anesthesia with diprovan 400 mg IV total.  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   Complications: No apparent complications Patient did tolerate procedure well.  Olga Millers, MD

## 2019-09-30 NOTE — Interval H&P Note (Signed)
History and Physical Interval Note:  09/30/2019 7:38 AM  Dominique Cruz  has presented today for surgery, with the diagnosis of MITRAL VALVE DISEASE.  The various methods of treatment have been discussed with the patient and family. After consideration of risks, benefits and other options for treatment, the patient has consented to  Procedure(s): TRANSESOPHAGEAL ECHOCARDIOGRAM (TEE) (N/A) as a surgical intervention.  The patient's history has been reviewed, patient examined, no change in status, stable for surgery.  I have reviewed the patient's chart and labs.  Questions were answered to the patient's satisfaction.     Olga Millers

## 2019-09-30 NOTE — Anesthesia Procedure Notes (Signed)
Procedure Name: MAC Date/Time: 09/30/2019 8:43 AM Performed by: Gaylene Brooks, CRNA Pre-anesthesia Checklist: Patient identified, Emergency Drugs available, Suction available and Patient being monitored Patient Re-evaluated:Patient Re-evaluated prior to induction Oxygen Delivery Method: Nasal cannula Preoxygenation: Pre-oxygenation with 100% oxygen Induction Type: IV induction Dental Injury: Teeth and Oropharynx as per pre-operative assessment

## 2019-09-30 NOTE — H&P (Signed)
Office Visit   Go to Cards  09/14/2019  [facility]     Photo of [place], Madolyn Frieze, MD       Lewayne Bunting, MD   Cardiology        Nonrheumatic mitral valve regurgitation +1 more   Dx        New Patient (Initial Visit) Mitral regurgitation; Referred by Georgann Housekeeper, MD   Reason for Visit        Additional Documentation   Vitals:       BP 116/72       Pulse 60       Ht 5\' 2"  (1.575 m)       Wt 66.7 kg       BMI 26.89 kg/m       BSA 1.71 m             More Vitals    Flowsheets:        Anthropometrics,       NEWS,       MEWS Score     Encounter Info:        Billing Info,       History,       Allergies,       Detailed Report            All Notes      Progress Notes by Lewayne Bunting, MD at 09/14/2019 10:00 AM   Author: Lewayne Bunting, MD Author Type: Physician Filed: 09/14/2019 10:40 AM  Note Status: Signed Cosign: Cosign Not Required Encounter Date: 09/14/2019  Editor: Lewayne Bunting, MD (Physician)              untitled image        Referring-Karrar Hussain MD  Reason for referral-mitral regurgitation     HPI: 79 year old female for evaluation of mitral regurgitation at request of Tyson Dense MD.  Echocardiogram December 2020 showed normal LV function, eccentric anteriorly directed moderate to severe mitral regurgitation, mild left atrial enlargement.  Laboratories November 2020 showed total cholesterol 207 with LDL of 109 and HDL 85.  BUN 16, creatinine 0.59.  Patient denies dyspnea on exertion, orthopnea, PND, pedal edema, chest pain, palpitations or syncope.            Current Outpatient Medications    Medication   Sig   Dispense   Refill    .   acetaminophen (TYLENOL) 650 MG CR tablet   Take 650 mg by mouth every 8 (eight) hours as needed for pain.            Marland Kitchen    ARTIFICIAL TEAR OP   Place 1 drop into both eyes 2 (two) times daily.            .   Ascorbic Acid (VITAMIN C PO)   Take by mouth as directed.             Marland Kitchen   aspirin 81 MG tablet   Take 81 mg by mouth daily.            .   B Complex-C (SUPER B COMPLEX PO)   Take by mouth as directed.             Marland Kitchen   BIOTIN PO   Take 1 capsule by mouth as directed.             .   Calcium Carbonate-Vitamin D (CALTRATE 600+D) 600-400 MG-UNIT per tablet  Take 1 tablet by mouth daily.             .   Coenzyme Q10 (CO Q 10) 100 MG CAPS   Take 100 mg by mouth daily.            .   diphenhydrAMINE (BENADRYL) 25 mg capsule   Take 25 mg by mouth every 6 (six) hours as needed for itching.            .   Ferrous Sulfate (SLOW FE PO)   Take 1 tablet by mouth daily.            .   fish oil-omega-3 fatty acids 1000 MG capsule   Take 1 g by mouth daily.              .   fluticasone (FLONASE) 50 MCG/ACT nasal spray   Place 2 sprays into both nostrils daily as needed for allergies.             Marland Kitchen   GLUCOSAMINE-CHONDROITIN PO   Take by mouth as directed.            .   INOSITOL-5 PO   Take by mouth as directed.             Marland Kitchen   LYSINE PO   Take by mouth as directed.             .   magnesium oxide (MAG-OX) 400 MG tablet   Take 400 mg by mouth daily.              Marland Kitchen   MAGNESIUM PO   Take by mouth as directed.             Marland Kitchen   MELATONIN PO   Take 6 mg by mouth as directed.            .   meloxicam (MOBIC) 15 MG tablet   Take 1 tablet (15 mg total) by mouth daily.   30 tablet   1    .   Misc Natural Products (TART CHERRY ADVANCED PO)   Take by mouth as directed.             .   Multiple Vitamin (MULTIVITAMIN) tablet   Take 1 tablet by mouth daily.              .   Multiple Vitamins-Minerals (ZINC PO)   Take by mouth.             .   nitroGLYCERIN (NITRODUR - DOSED IN MG/24 HR) 0.2 mg/hr patch   Apply 1/4th patch to affected area, change daily   30 patch   1    .   olmesartan (BENICAR) 20 MG tablet                .   oxybutynin (DITROPAN) 5 MG tablet   Take 2.5 mg by mouth 2 (two) times daily.            .   pantoprazole (PROTONIX) 20 MG tablet   Take 20 mg by mouth daily.            .   Potassium 99 MG TABS   Take 1 tablet by mouth daily.              .   TURMERIC PO   Take by mouth as directed.            Marland Kitchen  valsartan (DIOVAN) 40 MG tablet   Take 40 mg by mouth daily.            .   vitamin B-12 (CYANOCOBALAMIN) 1000 MCG tablet   Take 1,000 mcg by mouth daily.              .   vitamin E 400 UNIT capsule   Take 400 Units by mouth daily.                  No current facility-administered medications for this visit.                Allergies    Allergen   Reactions    .   Ao-Disc Catalyst                Eye swelled shut    .   Sulfa Drugs Cross Reactors   Other (See Comments)            unknown                Past Medical History:    Diagnosis   Date    .   Anemia        .   GERD (gastroesophageal reflux disease)        .   History of chicken pox        .   Hypertension        .   Mumps        .   Overactive bladder        .   PE (pulmonary embolism)            2012    .   Poison ivy            both arms healing well on prednisone     .   Scoliosis        .   Swelling            FEET AND LEGS B/L    .   Weakness of left leg                    Past Surgical History:    Procedure   Laterality   Date    .   COLONOSCOPY WITH PROPOFOL   N/A   11/24/2016        Procedure: COLONOSCOPY WITH PROPOFOL;  Surgeon: Garlan Fair, MD;  Location: [facility];  Service:  Endoscopy;  Laterality: N/A;    .   FOOT SURGERY   Bilateral            BUNION    .   FOOT SURGERY   Right            TENDON right ankle    .   KNEE SURGERY   Right            arthrscopy and meniscus repair           Social History             Socioeconomic History    .   Marital status:   Married            Spouse name:   Not on file    .   Number of children:   2    .   Years of education:   Not on file    .   Highest education level:   Not  on file    Occupational History    .   Occupation:   Retired    Tobacco Use    .   Smoking status:   Former Smoker            Types:   Cigarettes            Quit date:   05/11/1961            Years since quitting:   58.3    .   Smokeless tobacco:   Never Used    .   Tobacco comment: smoked few years    Substance and Sexual Activity    .   Alcohol use:   Yes            Alcohol/week:   0.0 standard drinks            Comment: weekly    .   Drug use:   No    .   Sexual activity:   Not on file    Other Topics   Concern    .   Not on file    Social History Narrative    .   Not on file        Social Determinants of Health           Financial Resource Strain:     .   Difficulty of Paying Living Expenses: Not on file    Food Insecurity:     .   Worried About Programme researcher, broadcasting/film/video in the Last Year: Not on file    .   Ran Out of Food in the Last Year: Not on file    Transportation Needs:     .   Lack of Transportation (Medical): Not on file    .   Lack of Transportation (Non-Medical): Not on file    Physical Activity:     .   Days of Exercise per Week: Not on file    .   Minutes of Exercise per Session: Not on file    Stress:     .   Feeling of Stress : Not on file    Social Connections:     .   Frequency of  Communication with Friends and Family: Not on file    .   Frequency of Social Gatherings with Friends and Family: Not on file    .   Attends Religious Services: Not on file    .   Active Member of Clubs or Organizations: Not on file    .   Attends Banker Meetings: Not on file    .   Marital Status: Not on file    Intimate Partner Violence:     .   Fear of Current or Ex-Partner: Not on file    .   Emotionally Abused: Not on file    .   Physically Abused: Not on file    .   Sexually Abused: Not on file                Family History    Problem   Relation   Age of Onset    .   Arthritis   Father        .   Heart disease   Father        .   Allergies   Father        .   Colon cancer  Mother        .   Heart disease   Mother        .   Mental illness   Mother        .   Allergies   Mother        .   Multiple sclerosis   Brother        .   Prostate cancer   Brother        .   Stroke   Brother        .   Healthy   Son                x1    .   Healthy   Daughter                x1          ROS: no fevers or chills, productive cough, hemoptysis, dysphasia, odynophagia, melena, hematochezia, dysuria, hematuria, rash, seizure activity, orthopnea, PND, pedal edema, claudication. Remaining systems are negative.     Physical Exam:      Blood pressure 116/72, pulse 60, height 5\' 2"  (1.575 m), weight 147 lb (66.7 kg).     General:  Well developed/well nourished in NAD  Skin warm/dry  Patient not depressed  No peripheral clubbing  Back-normal  HEENT-normal/normal eyelids  Neck supple/normal carotid upstroke bilaterally; no bruits; no JVD; no thyromegaly  chest - CTA/ normal expansion  CV - RRR/normal S1 and S2; no rubs or gallops;  PMI nondisplaced; 3/6 systolic murmur apex.  Abdomen -NT/ND, no HSM, no  mass, + bowel sounds, no bruit  2+ femoral pulses, no bruits  Ext-no edema, chords, 2+ DP  Neuro-grossly nonfocal     ECG -sinus rhythm at a rate of 60, no ST changes.  Personally reviewed     A/P     1 mitral regurgitation-I have personally reviewed the patient's transthoracic echocardiogram.  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  However it is clearly moderate to severe.  We will arrange a transesophageal echocardiogram to better assess severity of mitral regurgitation and valve morphology.  I had a long discussion with patient today concerning options.  If she indeed has severe mitral regurgitation one option would be to follow for symptoms and do serial echocardiograms.  Would proceed with mitral valve repair if she develops symptoms, LV dysfunction or left ventricular enlargement.  Alternatively we could consider early repair to avoid the possible complications associated with this including atrial fibrillation and LV dysfunction.  We will discuss this again after her transesophageal echocardiogram.     2 hypertension-blood pressure controlled.  Continue present medications.     3 history of pulmonary embolus-occurred in the setting of travel.     , MD    For TEE; no changes Olga Millers

## 2019-09-30 NOTE — Progress Notes (Signed)
  Echocardiogram Echocardiogram Transesophageal has been performed.  Gerda Diss 09/30/2019, 9:14 AM

## 2019-09-30 NOTE — Transfer of Care (Signed)
Immediate Anesthesia Transfer of Care Note  Patient: BRENTNEY GOLDBACH  Procedure(s) Performed: TRANSESOPHAGEAL ECHOCARDIOGRAM (TEE) (N/A )  Patient Location: Endoscopy Unit  Anesthesia Type:MAC  Level of Consciousness: drowsy and patient cooperative  Airway & Oxygen Therapy: Patient Spontanous Breathing and Patient connected to nasal cannula oxygen  Post-op Assessment: Report given to RN, Post -op Vital signs reviewed and stable and Patient moving all extremities X 4  Post vital signs: Reviewed and stable  Last Vitals:  Vitals Value Taken Time  BP 123/65 09/30/19 0903  Temp 36.3 C 09/30/19 0903  Pulse 61 09/30/19 0905  Resp 18 09/30/19 0905  SpO2 100 % 09/30/19 0905  Vitals shown include unvalidated device data.  Last Pain:  Vitals:   09/30/19 0903  TempSrc: Temporal  PainSc:          Complications: No apparent anesthesia complications

## 2019-09-30 NOTE — Anesthesia Postprocedure Evaluation (Signed)
Anesthesia Post Note  Patient: Dominique Cruz  Procedure(s) Performed: TRANSESOPHAGEAL ECHOCARDIOGRAM (TEE) (N/A )     Patient location during evaluation: Endoscopy Anesthesia Type: MAC Level of consciousness: awake and alert Pain management: pain level controlled Vital Signs Assessment: post-procedure vital signs reviewed and stable Respiratory status: spontaneous breathing, nonlabored ventilation, respiratory function stable and patient connected to nasal cannula oxygen Cardiovascular status: blood pressure returned to baseline and stable Postop Assessment: no apparent nausea or vomiting Anesthetic complications: no    Last Vitals:  Vitals:   09/30/19 0903 09/30/19 0913  BP: 123/65 133/65  Pulse: (!) 59 (!) 53  Resp: 18 12  Temp: (!) 36.3 C   SpO2: 100% 100%    Last Pain:  Vitals:   09/30/19 0913  TempSrc:   PainSc: 0-No pain                 Barnet Glasgow

## 2019-10-04 NOTE — Progress Notes (Signed)
HPI: Follow-up mitral regurgitation. Echocardiogram December 2020 showed normal LV function, eccentric anteriorly directed moderate to severe mitral regurgitation, mild left atrial enlargement. 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  Since last seen patient denies dyspnea, chest pain, palpitations or syncope.  Current Outpatient Medications  Medication Sig Dispense Refill  . acetaminophen (TYLENOL) 650 MG CR tablet Take 650 mg by mouth every 8 (eight) hours as needed for pain.    Marland Kitchen ARTIFICIAL TEAR OP Place 1 drop into both eyes 2 (two) times daily.    Marland Kitchen aspirin 81 MG tablet Take 81 mg by mouth daily.    . B Complex-C (SUPER B COMPLEX PO) Take 1 tablet by mouth daily.     Marland Kitchen BIOTIN PO Take 1 capsule by mouth daily.     . Calcium Carbonate-Vitamin D (CALTRATE 600+D) 600-400 MG-UNIT per tablet Take 1 tablet by mouth daily.     . Cholecalciferol (VITAMIN D3) 10 MCG (400 UNIT) tablet Take 400 Units by mouth daily.    . Coenzyme Q10 (CO Q 10) 100 MG CAPS Take 100 mg by mouth daily.    . fish oil-omega-3 fatty acids 1000 MG capsule Take 1 g by mouth daily.      . fluticasone (FLONASE) 50 MCG/ACT nasal spray Place 2 sprays into both nostrils daily.     . Folic Acid (FOLATE PO) Take 935 mcg by mouth daily.    Marland Kitchen GLUCOSAMINE-CHONDROITIN PO Take 1,500 mg by mouth daily.     . L-ARGININE PO Take 1,500 mg by mouth 2 (two) times daily.    Marland Kitchen LYSINE PO Take 1,000 mg by mouth daily.     . magnesium oxide (MAG-OX) 400 MG tablet Take 500 mg by mouth daily.     Marland Kitchen MELATONIN PO Take 6 mg by mouth at bedtime.     . Misc Natural Products (TART CHERRY ADVANCED PO) Take 1,000 mg by mouth daily.     . Multiple Vitamin (MULTIVITAMIN) tablet Take 1 tablet by mouth daily.      Marland Kitchen olmesartan (BENICAR) 20  MG tablet Take 20 mg by mouth daily.     Marland Kitchen oxybutynin (DITROPAN) 5 MG tablet Take 5 mg by mouth daily.     . pantoprazole (PROTONIX) 20 MG tablet Take 20 mg by mouth daily.    . Red Yeast Rice 600 MG TABS Take 600 mg by mouth 2 (two) times daily.    . vitamin B-12 (CYANOCOBALAMIN) 1000 MCG tablet Take 1,000 mcg by mouth daily.      . vitamin E 400 UNIT capsule Take 400 Units by mouth daily.       No current facility-administered medications for this visit.     Past Medical History:  Diagnosis Date  . Anemia   . GERD (gastroesophageal reflux disease)   . History of chicken pox   . Hypertension   . Mumps   . Overactive bladder   . PE (pulmonary embolism)    2012  . Poison ivy    both arms healing well on prednisone   . Scoliosis   . Swelling    FEET AND LEGS B/L  . Weakness of left leg     Past Surgical History:  Procedure Laterality Date  . COLONOSCOPY WITH PROPOFOL N/A 11/24/2016   Procedure: COLONOSCOPY WITH PROPOFOL;  Surgeon: Charolett Bumpers,  MD;  Location: [facility];  Service: Endoscopy;  Laterality: N/A;  . FOOT SURGERY Bilateral    BUNION  . FOOT SURGERY Right    TENDON right ankle  . KNEE SURGERY Right    arthrscopy and meniscus repair  . TEE WITHOUT CARDIOVERSION N/A 09/30/2019   Procedure: TRANSESOPHAGEAL ECHOCARDIOGRAM (TEE);  Surgeon: Lewayne Bunting, MD;  Location: [facility] ENDOSCOPY;  Service: Cardiovascular;  Laterality: N/A;    Social History   Socioeconomic History  . Marital status: Married    Spouse name: Not on file  . Number of children: 2  . Years of education: Not on file  . Highest education level: Not on file  Occupational History  . Occupation: Retired  Tobacco Use  . Smoking status: Former Smoker    Types: Cigarettes    Quit date: 05/11/1961    Years since quitting: 58.4  . Smokeless tobacco: Never Used  . Tobacco comment: smoked few years  Substance and Sexual Activity  . Alcohol use: Yes    Alcohol/week: 0.0 standard drinks     Comment: weekly  . Drug use: No  . Sexual activity: Not on file  Other Topics Concern  . Not on file  Social History Narrative  . Not on file   Social Determinants of Health   Financial Resource Strain:   . Difficulty of Paying Living Expenses: Not on file  Food Insecurity:   . Worried About Programme researcher, broadcasting/film/video in the Last Year: Not on file  . Ran Out of Food in the Last Year: Not on file  Transportation Needs:   . Lack of Transportation (Medical): Not on file  . Lack of Transportation (Non-Medical): Not on file  Physical Activity:   . Days of Exercise per Week: Not on file  . Minutes of Exercise per Session: Not on file  Stress:   . Feeling of Stress : Not on file  Social Connections:   . Frequency of Communication with Friends and Family: Not on file  . Frequency of Social Gatherings with Friends and Family: Not on file  . Attends Religious Services: Not on file  . Active Member of Clubs or Organizations: Not on file  . Attends Banker Meetings: Not on file  . Marital Status: Not on file  Intimate Partner Violence:   . Fear of Current or Ex-Partner: Not on file  . Emotionally Abused: Not on file  . Physically Abused: Not on file  . Sexually Abused: Not on file    Family History  Problem Relation Age of Onset  . Arthritis Father   . Heart disease Father   . Allergies Father   . Colon cancer Mother   . Heart disease Mother   . Mental illness Mother   . Allergies Mother   . Multiple sclerosis Brother   . Prostate cancer Brother   . Stroke Brother   . Healthy Son        x1  . Healthy Daughter        x1    ROS: no fevers or chills, productive cough, hemoptysis, dysphasia, odynophagia, melena, hematochezia, dysuria, hematuria, rash, seizure activity, orthopnea, PND, pedal edema, claudication. Remaining systems are negative.  Physical Exam: Well-developed well-nourished in no acute distress.  Skin is warm and dry.  HEENT is normal.  Neck is supple.   Chest is clear to auscultation with normal expansion.  Cardiovascular exam is regular rate and rhythm.  3/6 systolic murmur apex. Abdominal exam nontender or distended.  No masses palpated. Extremities show no edema. 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  There is resultant severe mitral regurgitation.  She is asymptomatic.  I discussed options today including early repair versus follow-up echocardiograms.  I explained that early repair could potentially diminish the possibilities of atrial fibrillation and reduced LV function in the future.  She would prefer conservative measures.  We will therefore plan follow-up echoes in the future and if LV function deteriorates, she develops left ventricular enlargement or she becomes symptomatic would need to proceed at that time.  We will plan to repeat echocardiogram in December or sooner if needed.  2 hypertension-blood pressure is controlled today.  Continue present medical regimen.  3 history of pulmonary embolus-occurred in the setting of travel.  Kirk Ruths, MD

## 2019-10-12 ENCOUNTER — Ambulatory Visit: Payer: Medicare PPO | Admitting: Cardiology

## 2019-10-12 ENCOUNTER — Other Ambulatory Visit: Payer: Self-pay

## 2019-10-12 ENCOUNTER — Encounter: Payer: Self-pay | Admitting: Cardiology

## 2019-10-12 VITALS — BP 132/66 | HR 66 | Ht 62.0 in | Wt 148.0 lb

## 2019-10-12 DIAGNOSIS — I340 Nonrheumatic mitral (valve) insufficiency: Secondary | ICD-10-CM

## 2019-10-12 DIAGNOSIS — I10 Essential (primary) hypertension: Secondary | ICD-10-CM

## 2019-10-12 NOTE — Patient Instructions (Signed)
Medication Instructions:  NO CHANGE *If you need a refill on your cardiac medications before your next appointment, please call your pharmacy*   Lab Work: If you have labs (blood work) drawn today and your tests are completely normal, you will receive your results only by: . MyChart Message (if you have MyChart) OR . A paper copy in the mail If you have any lab test that is abnormal or we need to change your treatment, we will call you to review the results.   Follow-Up: At [facility], you and your health needs are our priority.  As part of our continuing mission to provide you with exceptional heart care, we have created designated Provider Care Teams.  These Care Teams include your primary Cardiologist (physician) and Advanced Practice Providers (APPs -  Physician Assistants and Nurse Practitioners) who all work together to provide you with the care you need, when you need it.  We recommend signing up for the patient portal called "MyChart".  Sign up information is provided on this After Visit Summary.  MyChart is used to connect with patients for Virtual Visits (Telemedicine).  Patients are able to view lab/test results, encounter notes, upcoming appointments, etc.  Non-urgent messages can be sent to your provider as well.   To learn more about what you can do with MyChart, go to [email or web address].    Your next appointment:   6 month(s)  The format for your next appointment:   In Person  Provider:   Brian Crenshaw, MD    

## 2019-10-19 DIAGNOSIS — Z1231 Encounter for screening mammogram for malignant neoplasm of breast: Secondary | ICD-10-CM | POA: Diagnosis not present

## 2019-10-19 DIAGNOSIS — Z6827 Body mass index (BMI) 27.0-27.9, adult: Secondary | ICD-10-CM | POA: Diagnosis not present

## 2019-10-19 DIAGNOSIS — Z01419 Encounter for gynecological examination (general) (routine) without abnormal findings: Secondary | ICD-10-CM | POA: Diagnosis not present

## 2019-11-01 DIAGNOSIS — R32 Unspecified urinary incontinence: Secondary | ICD-10-CM | POA: Diagnosis not present

## 2019-11-01 DIAGNOSIS — K219 Gastro-esophageal reflux disease without esophagitis: Secondary | ICD-10-CM | POA: Diagnosis not present

## 2019-11-01 DIAGNOSIS — I10 Essential (primary) hypertension: Secondary | ICD-10-CM | POA: Diagnosis not present

## 2019-11-01 DIAGNOSIS — I340 Nonrheumatic mitral (valve) insufficiency: Secondary | ICD-10-CM | POA: Diagnosis not present

## 2019-11-01 DIAGNOSIS — M1990 Unspecified osteoarthritis, unspecified site: Secondary | ICD-10-CM | POA: Diagnosis not present

## 2020-04-23 NOTE — Progress Notes (Signed)
HPI: Follow-up mitral regurgitation. Echocardiogram December 2020 showed normal LV function, eccentric anteriorly directed moderate to severe mitral regurgitation, mild left atrial enlargement. 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 Since last seen there is no DOE, chest pain, palpitations or syncope; mild pedal edema.  Current Outpatient Medications  Medication Sig Dispense Refill  . acetaminophen (TYLENOL) 650 MG CR tablet Take 650 mg by mouth every 8 (eight) hours as needed for pain.    Marland Kitchen ARTIFICIAL TEAR OP Place 1 drop into both eyes 2 (two) times daily.    Marland Kitchen aspirin 81 MG tablet Take 81 mg by mouth daily.    . B Complex-C (SUPER B COMPLEX PO) Take 1 tablet by mouth daily.     Marland Kitchen BIOTIN PO Take 1 capsule by mouth daily.     . Calcium Carbonate-Vitamin D (CALTRATE 600+D) 600-400 MG-UNIT per tablet Take 1 tablet by mouth daily.     . Cholecalciferol (VITAMIN D3) 10 MCG (400 UNIT) tablet Take 400 Units by mouth daily.    . Coenzyme Q10 (CO Q 10) 100 MG CAPS Take 100 mg by mouth daily.    . fish oil-omega-3 fatty acids 1000 MG capsule Take 1 g by mouth daily.      . fluticasone (FLONASE) 50 MCG/ACT nasal spray Place 2 sprays into both nostrils daily.     . Folic Acid (FOLATE PO) Take 935 mcg by mouth daily.    Marland Kitchen GLUCOSAMINE-CHONDROITIN PO Take 1,500 mg by mouth daily.     . L-ARGININE PO Take 1,500 mg by mouth 2 (two) times daily.    Marland Kitchen LYSINE PO Take 1,000 mg by mouth daily.     . magnesium oxide (MAG-OX) 400 MG tablet Take 500 mg by mouth daily.     Marland Kitchen MELATONIN PO Take 6 mg by mouth at bedtime.     . Misc Natural Products (TART CHERRY ADVANCED PO) Take 1,000 mg by mouth daily.     . Multiple Vitamin (MULTIVITAMIN) tablet Take 1 tablet by mouth daily.      Marland Kitchen olmesartan  (BENICAR) 20 MG tablet Take 20 mg by mouth daily.     Marland Kitchen oxybutynin (DITROPAN) 5 MG tablet Take 5 mg by mouth daily.     . pantoprazole (PROTONIX) 20 MG tablet Take 20 mg by mouth daily.    . Quercetin 250 MG TABS Take 250 mg by mouth daily.    . Red Yeast Rice 600 MG TABS Take 600 mg by mouth 2 (two) times daily.    . vitamin B-12 (CYANOCOBALAMIN) 1000 MCG tablet Take 1,000 mcg by mouth daily.      . vitamin E 400 UNIT capsule Take 400 Units by mouth daily.       No current facility-administered medications for this visit.     Past Medical History:  Diagnosis Date  . Anemia   . GERD (gastroesophageal reflux disease)   . History of chicken pox   . Hypertension   . Mumps   . Overactive bladder   . PE (pulmonary embolism)    2012  . Poison ivy    both arms healing well on prednisone   . Scoliosis   . Swelling    FEET AND LEGS B/L  . Weakness of left leg     Past Surgical History:  Procedure Laterality Date  .  COLONOSCOPY WITH PROPOFOL N/A 11/24/2016   Procedure: COLONOSCOPY WITH PROPOFOL;  Surgeon: Charolett Bumpers, MD;  Location: [facility] ENDOSCOPY;  Service: Endoscopy;  Laterality: N/A;  . FOOT SURGERY Bilateral    BUNION  . FOOT SURGERY Right    TENDON right ankle  . KNEE SURGERY Right    arthrscopy and meniscus repair  . TEE WITHOUT CARDIOVERSION N/A 09/30/2019   Procedure: TRANSESOPHAGEAL ECHOCARDIOGRAM (TEE);  Surgeon: Lewayne Bunting, MD;  Location: [facility] ENDOSCOPY;  Service: Cardiovascular;  Laterality: N/A;    Social History   Socioeconomic History  . Marital status: Married    Spouse name: Not on file  . Number of children: 2  . Years of education: Not on file  . Highest education level: Not on file  Occupational History  . Occupation: Retired  Tobacco Use  . Smoking status: Former Smoker    Types: Cigarettes    Quit date: 05/11/1961    Years since quitting: 59.0  . Smokeless tobacco: Never Used  . Tobacco comment: smoked few years  Substance and Sexual  Activity  . Alcohol use: Yes    Alcohol/week: 0.0 standard drinks    Comment: weekly  . Drug use: No  . Sexual activity: Not on file  Other Topics Concern  . Not on file  Social History Narrative  . Not on file   Social Determinants of Health   Financial Resource Strain:   . Difficulty of Paying Living Expenses: Not on file  Food Insecurity:   . Worried About Programme researcher, broadcasting/film/video in the Last Year: Not on file  . Ran Out of Food in the Last Year: Not on file  Transportation Needs:   . Lack of Transportation (Medical): Not on file  . Lack of Transportation (Non-Medical): Not on file  Physical Activity:   . Days of Exercise per Week: Not on file  . Minutes of Exercise per Session: Not on file  Stress:   . Feeling of Stress : Not on file  Social Connections:   . Frequency of Communication with Friends and Family: Not on file  . Frequency of Social Gatherings with Friends and Family: Not on file  . Attends Religious Services: Not on file  . Active Member of Clubs or Organizations: Not on file  . Attends Banker Meetings: Not on file  . Marital Status: Not on file  Intimate Partner Violence:   . Fear of Current or Ex-Partner: Not on file  . Emotionally Abused: Not on file  . Physically Abused: Not on file  . Sexually Abused: Not on file    Family History  Problem Relation Age of Onset  . Arthritis Father   . Heart disease Father   . Allergies Father   . Colon cancer Mother   . Heart disease Mother   . Mental illness Mother   . Allergies Mother   . Multiple sclerosis Brother   . Prostate cancer Brother   . Stroke Brother   . Healthy Son        x1  . Healthy Daughter        x1    ROS: no fevers or chills, productive cough, hemoptysis, dysphasia, odynophagia, melena, hematochezia, dysuria, hematuria, rash, seizure activity, orthopnea, PND, claudication. Remaining systems are negative.  Physical Exam: Well-developed well-nourished in no acute distress.   Skin is warm and dry.  HEENT is normal.  Neck is supple.  Chest is clear to auscultation with normal expansion.  Cardiovascular exam is  regular rate and rhythm. 3/6 systolic murmur apex Abdominal exam nontender or distended. No masses palpated. Extremities show trace to 1+ edema. 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  Patient remains asymptomatic.  We have again discussed options of early repair to diminish possibilities of reduced LV function and atrial fibrillation but she would prefer to continue to be conservative.  We will plan follow-up echocardiogram February 2022.  She understands that if she develops left ventricular enlargement or worsening LV function that we will need to proceed with mitral valve repair.  2 hypertension-patient's blood pressure is controlled.  Continue present medications.  3 prior pulmonary embolus-occurred in the setting of traveling.  Olga Millers, MD

## 2020-05-02 ENCOUNTER — Encounter: Payer: Self-pay | Admitting: Cardiology

## 2020-05-02 ENCOUNTER — Ambulatory Visit: Payer: Medicare PPO | Admitting: Cardiology

## 2020-05-02 ENCOUNTER — Other Ambulatory Visit: Payer: Self-pay

## 2020-05-02 VITALS — BP 138/74 | HR 89 | Ht 62.0 in | Wt 155.0 lb

## 2020-05-02 DIAGNOSIS — I10 Essential (primary) hypertension: Secondary | ICD-10-CM | POA: Diagnosis not present

## 2020-05-02 DIAGNOSIS — I340 Nonrheumatic mitral (valve) insufficiency: Secondary | ICD-10-CM | POA: Diagnosis not present

## 2020-05-02 NOTE — Patient Instructions (Signed)

## 2020-06-12 DIAGNOSIS — R32 Unspecified urinary incontinence: Secondary | ICD-10-CM | POA: Diagnosis not present

## 2020-06-12 DIAGNOSIS — J309 Allergic rhinitis, unspecified: Secondary | ICD-10-CM | POA: Diagnosis not present

## 2020-06-12 DIAGNOSIS — M8588 Other specified disorders of bone density and structure, other site: Secondary | ICD-10-CM | POA: Diagnosis not present

## 2020-06-12 DIAGNOSIS — K219 Gastro-esophageal reflux disease without esophagitis: Secondary | ICD-10-CM | POA: Diagnosis not present

## 2020-06-12 DIAGNOSIS — I340 Nonrheumatic mitral (valve) insufficiency: Secondary | ICD-10-CM | POA: Diagnosis not present

## 2020-06-12 DIAGNOSIS — I10 Essential (primary) hypertension: Secondary | ICD-10-CM | POA: Diagnosis not present

## 2020-06-12 DIAGNOSIS — Z1389 Encounter for screening for other disorder: Secondary | ICD-10-CM | POA: Diagnosis not present

## 2020-06-12 DIAGNOSIS — R7309 Other abnormal glucose: Secondary | ICD-10-CM | POA: Diagnosis not present

## 2020-06-12 DIAGNOSIS — E7800 Pure hypercholesterolemia, unspecified: Secondary | ICD-10-CM | POA: Diagnosis not present

## 2020-06-12 DIAGNOSIS — Z0000 Encounter for general adult medical examination without abnormal findings: Secondary | ICD-10-CM | POA: Diagnosis not present

## 2020-06-12 DIAGNOSIS — Z23 Encounter for immunization: Secondary | ICD-10-CM | POA: Diagnosis not present

## 2020-06-12 DIAGNOSIS — I700 Atherosclerosis of aorta: Secondary | ICD-10-CM | POA: Diagnosis not present

## 2020-07-04 ENCOUNTER — Other Ambulatory Visit: Payer: Self-pay | Admitting: Internal Medicine

## 2020-07-04 DIAGNOSIS — M8588 Other specified disorders of bone density and structure, other site: Secondary | ICD-10-CM

## 2020-08-17 DIAGNOSIS — H26493 Other secondary cataract, bilateral: Secondary | ICD-10-CM | POA: Diagnosis not present

## 2020-08-17 DIAGNOSIS — H524 Presbyopia: Secondary | ICD-10-CM | POA: Diagnosis not present

## 2020-08-17 DIAGNOSIS — H35033 Hypertensive retinopathy, bilateral: Secondary | ICD-10-CM | POA: Diagnosis not present

## 2020-10-09 ENCOUNTER — Other Ambulatory Visit: Payer: Medicare PPO

## 2020-10-22 NOTE — Progress Notes (Signed)
HPI: Follow-up mitral regurgitation. Echocardiogram December 2020 showed normal LV function, eccentric anteriorly directed moderate to severe mitral regurgitation, mild LAE. 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 Since last seenshe denies increased dyspnea, chest pain, palpitations or syncope.  She has chronic mild pedal edema.  Current Outpatient Medications  Medication Sig Dispense Refill  . acetaminophen (TYLENOL) 650 MG CR tablet Take 650 mg by mouth every 8 (eight) hours as needed for pain.    Marland Kitchen ARTIFICIAL TEAR OP Place 1 drop into both eyes 2 (two) times daily.    Marland Kitchen aspirin 81 MG tablet Take 81 mg by mouth daily.    . B Complex-C (SUPER B COMPLEX PO) Take 1 tablet by mouth daily.     Marland Kitchen BIOTIN PO Take 1 capsule by mouth daily.     . Calcium Carbonate-Vitamin D 600-400 MG-UNIT tablet Take 1 tablet by mouth daily.    . Cholecalciferol (VITAMIN D3) 10 MCG (400 UNIT) tablet Take 400 Units by mouth daily.    . Coenzyme Q10 (CO Q 10) 100 MG CAPS Take 100 mg by mouth daily.    . fish oil-omega-3 fatty acids 1000 MG capsule Take 1 g by mouth daily.    . fluticasone (FLONASE) 50 MCG/ACT nasal spray Place 2 sprays into both nostrils daily.    . Folic Acid (FOLATE PO) Take 935 mcg by mouth daily.    Marland Kitchen GLUCOSAMINE-CHONDROITIN PO Take 1,500 mg by mouth daily.     . L-ARGININE PO Take 1,500 mg by mouth 2 (two) times daily.    Marland Kitchen LYSINE PO Take 1,000 mg by mouth daily.     . magnesium oxide (MAG-OX) 400 MG tablet Take 500 mg by mouth daily.    Marland Kitchen MELATONIN PO Take 6 mg by mouth at bedtime.     . Misc Natural Products (TART CHERRY ADVANCED PO) Take 1,000 mg by mouth daily.     . Multiple Vitamin (MULTIVITAMIN) tablet Take 1 tablet by mouth daily.    Marland Kitchen olmesartan (BENICAR) 20 MG tablet  Take 20 mg by mouth daily.     Marland Kitchen oxybutynin (DITROPAN) 5 MG tablet Take 5 mg by mouth daily.     . pantoprazole (PROTONIX) 20 MG tablet Take 20 mg by mouth daily.    . Quercetin 250 MG TABS Take 250 mg by mouth daily.    . vitamin B-12 (CYANOCOBALAMIN) 1000 MCG tablet Take 1,000 mcg by mouth daily.    . vitamin E 400 UNIT capsule Take 400 Units by mouth daily.     No current facility-administered medications for this visit.     Past Medical History:  Diagnosis Date  . Anemia   . GERD (gastroesophageal reflux disease)   . History of chicken pox   . Hypertension   . Mumps   . Overactive bladder   . PE (pulmonary embolism)    2012  . Poison ivy    both arms healing well on prednisone   . Scoliosis   . Swelling    FEET AND LEGS B/L  . Weakness of left leg     Past Surgical History:  Procedure Laterality Date  . COLONOSCOPY WITH PROPOFOL N/A 11/24/2016   Procedure: COLONOSCOPY WITH PROPOFOL;  Surgeon: Charolett Bumpers, MD;  Location: [facility] ENDOSCOPY;  Service: Endoscopy;  Laterality: N/A;  . FOOT SURGERY  Bilateral    BUNION  . FOOT SURGERY Right    TENDON right ankle  . KNEE SURGERY Right    arthrscopy and meniscus repair  . TEE WITHOUT CARDIOVERSION N/A 09/30/2019   Procedure: TRANSESOPHAGEAL ECHOCARDIOGRAM (TEE);  Surgeon: Lewayne Bunting, MD;  Location: [facility] ENDOSCOPY;  Service: Cardiovascular;  Laterality: N/A;    Social History   Socioeconomic History  . Marital status: Married    Spouse name: Not on file  . Number of children: 2  . Years of education: Not on file  . Highest education level: Not on file  Occupational History  . Occupation: Retired  Tobacco Use  . Smoking status: Former Smoker    Types: Cigarettes    Quit date: 05/11/1961    Years since quitting: 59.4  . Smokeless tobacco: Never Used  . Tobacco comment: smoked few years  Substance and Sexual Activity  . Alcohol use: Yes    Alcohol/week: 0.0 standard drinks    Comment: weekly  . Drug use: No   . Sexual activity: Not on file  Other Topics Concern  . Not on file  Social History Narrative  . Not on file   Social Determinants of Health   Financial Resource Strain: Not on file  Food Insecurity: Not on file  Transportation Needs: Not on file  Physical Activity: Not on file  Stress: Not on file  Social Connections: Not on file  Intimate Partner Violence: Not on file    Family History  Problem Relation Age of Onset  . Arthritis Father   . Heart disease Father   . Allergies Father   . Colon cancer Mother   . Heart disease Mother   . Mental illness Mother   . Allergies Mother   . Multiple sclerosis Brother   . Prostate cancer Brother   . Stroke Brother   . Healthy Son        x1  . Healthy Daughter        x1    ROS: no fevers or chills, productive cough, hemoptysis, dysphasia, odynophagia, melena, hematochezia, dysuria, hematuria, rash, seizure activity, orthopnea, PND, pedal edema, claudication. Remaining systems are negative.  Physical Exam: Well-developed well-nourished in no acute distress.  Skin is warm and dry.  HEENT is normal.  Neck is supple.  Chest is clear to auscultation with normal expansion.  Cardiovascular exam is regular rate and rhythm.  3/6 systolic murmur apex radiating to left axilla. Abdominal exam nontender or distended. No masses palpated. Extremities show trace edema. neuro grossly intact  ECG-normal sinus rhythm at a rate of 63, no ST changes.  Personally reviewed  A/P  1 severe mitral regurgitation-secondary to prolapse of P2.  She remains asymptomatic.  I have again reviewed the options with her including early repair to avoid potential LV dysfunction and atrial fibrillation in the future versus continued follow-up echocardiograms and proceeding if she develops symptoms, left ventricular enlargement or decreased LV function.  I have recommended early repair and she is potentially agreeable but would like to do this later in the year.   She will contact us when she is willing to proceed.  We will arrange a right and left catheterization when she wants to proceed.  Plan repeat echocardiogram now to reassess LV function, left ventricular size and severity of mitral regurgitation.    2 hypertension-blood pressure controlled.  Continue present medical regimen and follow.  Olga Millers, MD

## 2020-10-24 ENCOUNTER — Other Ambulatory Visit: Payer: Self-pay

## 2020-10-24 ENCOUNTER — Ambulatory Visit: Payer: Medicare PPO | Admitting: Cardiology

## 2020-10-24 ENCOUNTER — Encounter: Payer: Self-pay | Admitting: Cardiology

## 2020-10-24 VITALS — BP 132/64 | HR 63 | Ht 62.0 in | Wt 155.0 lb

## 2020-10-24 DIAGNOSIS — I10 Essential (primary) hypertension: Secondary | ICD-10-CM | POA: Diagnosis not present

## 2020-10-24 DIAGNOSIS — I340 Nonrheumatic mitral (valve) insufficiency: Secondary | ICD-10-CM | POA: Diagnosis not present

## 2020-10-24 NOTE — Patient Instructions (Signed)
  Testing/Procedures:  Your physician has requested that you have an echocardiogram. Echocardiography is a painless test that uses sound waves to create images of your heart. It provides your doctor with information about the size and shape of your heart and how well your heart's chambers and valves are working. This procedure takes approximately one hour. There are no restrictions for this procedure.[place] [facility]-1 ST FLOOR IMAGING DEPARTMENT     Follow-Up: At [facility], you and your health needs are our priority.  As part of our continuing mission to provide you with exceptional heart care, we have created designated Provider Care Teams.  These Care Teams include your primary Cardiologist (physician) and Advanced Practice Providers (APPs -  Physician Assistants and Nurse Practitioners) who all work together to provide you with the care you need, when you need it.  We recommend signing up for the patient portal called "MyChart".  Sign up information is provided on this After Visit Summary.  MyChart is used to connect with patients for Virtual Visits (Telemedicine).  Patients are able to view lab/test results, encounter notes, upcoming appointments, etc.  Non-urgent messages can be sent to your provider as well.   To learn more about what you can do with MyChart, go to [email or web address].    Your next appointment:   6 month(s)  The format for your next appointment:   In Person  Provider:   Brian Crenshaw, MD    

## 2020-10-31 DIAGNOSIS — I10 Essential (primary) hypertension: Secondary | ICD-10-CM | POA: Diagnosis not present

## 2020-10-31 DIAGNOSIS — Z86711 Personal history of pulmonary embolism: Secondary | ICD-10-CM | POA: Diagnosis not present

## 2020-10-31 DIAGNOSIS — K219 Gastro-esophageal reflux disease without esophagitis: Secondary | ICD-10-CM | POA: Diagnosis not present

## 2020-10-31 DIAGNOSIS — I340 Nonrheumatic mitral (valve) insufficiency: Secondary | ICD-10-CM | POA: Diagnosis not present

## 2020-10-31 DIAGNOSIS — I700 Atherosclerosis of aorta: Secondary | ICD-10-CM | POA: Diagnosis not present

## 2020-11-06 DIAGNOSIS — M25511 Pain in right shoulder: Secondary | ICD-10-CM | POA: Diagnosis not present

## 2020-11-07 DIAGNOSIS — Z780 Asymptomatic menopausal state: Secondary | ICD-10-CM | POA: Diagnosis not present

## 2020-11-07 DIAGNOSIS — M8589 Other specified disorders of bone density and structure, multiple sites: Secondary | ICD-10-CM | POA: Diagnosis not present

## 2020-11-28 ENCOUNTER — Ambulatory Visit (HOSPITAL_BASED_OUTPATIENT_CLINIC_OR_DEPARTMENT_OTHER)
Admission: RE | Admit: 2020-11-28 | Discharge: 2020-11-28 | Disposition: A | Discharge status: Home / Self Care | Payer: Medicare PPO | Source: Ambulatory Visit | Attending: Cardiology | Admitting: Cardiology

## 2020-11-28 ENCOUNTER — Other Ambulatory Visit: Payer: Self-pay

## 2020-11-28 ENCOUNTER — Ambulatory Visit (HOSPITAL_BASED_OUTPATIENT_CLINIC_OR_DEPARTMENT_OTHER): Payer: Medicare PPO

## 2020-11-28 DIAGNOSIS — I341 Nonrheumatic mitral (valve) prolapse: Secondary | ICD-10-CM | POA: Diagnosis not present

## 2020-11-28 DIAGNOSIS — I340 Nonrheumatic mitral (valve) insufficiency: Secondary | ICD-10-CM | POA: Diagnosis not present

## 2020-11-28 NOTE — Progress Notes (Signed)
  Echocardiogram 2D Echocardiogram has been performed.  Janalyn Harder 11/28/2020, 3:07 PM

## 2020-11-29 LAB — ECHOCARDIOGRAM COMPLETE
Area-P 1/2: 3.17 cm2
Calc EF: 62.7 %
MV M vel: 4.49 m/s
MV Peak grad: 80.6 mmHg
MV VTI: 1.74 cm2
Radius: 0.83 cm
S' Lateral: 3.02 cm
Single Plane A2C EF: 57.9 %
Single Plane A4C EF: 68.2 %

## 2020-11-30 ENCOUNTER — Ambulatory Visit (INDEPENDENT_AMBULATORY_CARE_PROVIDER_SITE_OTHER): Payer: Medicare PPO

## 2020-11-30 ENCOUNTER — Encounter: Payer: Self-pay | Admitting: Podiatry

## 2020-11-30 ENCOUNTER — Other Ambulatory Visit: Payer: Self-pay

## 2020-11-30 ENCOUNTER — Ambulatory Visit: Payer: Medicare Other | Admitting: Podiatry

## 2020-11-30 DIAGNOSIS — M79671 Pain in right foot: Secondary | ICD-10-CM

## 2020-11-30 DIAGNOSIS — S99922A Unspecified injury of left foot, initial encounter: Secondary | ICD-10-CM

## 2020-11-30 DIAGNOSIS — S92352A Displaced fracture of fifth metatarsal bone, left foot, initial encounter for closed fracture: Secondary | ICD-10-CM

## 2020-11-30 NOTE — Progress Notes (Signed)
Subjective:   Patient ID: Dominique Cruz, female   DOB: 80 y.o.   MRN: [id number]   HPI Patient presents stating I fell on my left foot approximate 10 days ago on April 20 and it has been painful and feels like there is a crack.  Also I need new orthotics   ROS      Objective:  Physical Exam  Neurovascular status intact swelling of the left lateral foot with inflammation of the distal metatarsal bone with severe collapse medial longitudinal arch bilateral with orthotics which have lost some of their support     Assessment:  Possibility for fracture left fifth metatarsal secondary to injury 10 days ago along with severe flatfoot deformity     Plan:  H&P reviewed condition and did discuss fracture and went ahead and immobilized with short air fracture boot.  I then discussed her flatfeet deformity and we are having a new pair of orthotics made for her that will be matched to when she had from several years ago which have lost their support  X-rays indicate fracture of the neck of the fifth metatarsal left relatively unstable will require immobilization

## 2020-12-12 ENCOUNTER — Telehealth: Payer: Self-pay | Admitting: Podiatry

## 2020-12-12 NOTE — Telephone Encounter (Signed)
Orthotics in... lvm for pt ok to pick up or to call if she feels she needs and appt to pick them up.

## 2021-01-05 DIAGNOSIS — Z20822 Contact with and (suspected) exposure to covid-19: Secondary | ICD-10-CM | POA: Diagnosis not present

## 2021-01-21 DIAGNOSIS — M25511 Pain in right shoulder: Secondary | ICD-10-CM | POA: Diagnosis not present

## 2021-01-22 DIAGNOSIS — Z124 Encounter for screening for malignant neoplasm of cervix: Secondary | ICD-10-CM | POA: Diagnosis not present

## 2021-01-22 DIAGNOSIS — Z6828 Body mass index (BMI) 28.0-28.9, adult: Secondary | ICD-10-CM | POA: Diagnosis not present

## 2021-01-22 DIAGNOSIS — Z1231 Encounter for screening mammogram for malignant neoplasm of breast: Secondary | ICD-10-CM | POA: Diagnosis not present

## 2021-01-24 ENCOUNTER — Other Ambulatory Visit: Payer: Self-pay | Admitting: Obstetrics & Gynecology

## 2021-01-24 DIAGNOSIS — R928 Other abnormal and inconclusive findings on diagnostic imaging of breast: Secondary | ICD-10-CM

## 2021-01-26 ENCOUNTER — Ambulatory Visit: Payer: Medicare PPO

## 2021-01-26 ENCOUNTER — Other Ambulatory Visit: Payer: Self-pay

## 2021-01-26 ENCOUNTER — Ambulatory Visit
Admission: RE | Admit: 2021-01-26 | Discharge: 2021-01-26 | Disposition: A | Discharge status: Home / Self Care | Payer: Medicare PPO | Source: Ambulatory Visit | Attending: Obstetrics & Gynecology | Admitting: Obstetrics & Gynecology

## 2021-01-26 DIAGNOSIS — R928 Other abnormal and inconclusive findings on diagnostic imaging of breast: Secondary | ICD-10-CM | POA: Diagnosis not present

## 2021-01-26 DIAGNOSIS — R922 Inconclusive mammogram: Secondary | ICD-10-CM | POA: Diagnosis not present

## 2021-02-07 DIAGNOSIS — M25512 Pain in left shoulder: Secondary | ICD-10-CM | POA: Diagnosis not present

## 2021-02-07 DIAGNOSIS — M25511 Pain in right shoulder: Secondary | ICD-10-CM | POA: Diagnosis not present

## 2021-02-18 DIAGNOSIS — G8929 Other chronic pain: Secondary | ICD-10-CM | POA: Diagnosis not present

## 2021-02-18 DIAGNOSIS — M25512 Pain in left shoulder: Secondary | ICD-10-CM | POA: Diagnosis not present

## 2021-02-18 DIAGNOSIS — M25511 Pain in right shoulder: Secondary | ICD-10-CM | POA: Diagnosis not present

## 2021-02-21 ENCOUNTER — Telehealth: Payer: Self-pay

## 2021-02-21 NOTE — Telephone Encounter (Signed)
   [facility] Pre-operative Risk Assessment    Patient Name: Dominique Cruz  DOB: 1940/10/22 MRN: [id number]  HEARTCARE STAFF:  - IMPORTANT!!!!!! Under Visit Info/Reason for Call, type in Other and utilize the format Clearance MM/DD/YY or Clearance TBD. Do not use dashes or single digits. - Please review there is not already an duplicate clearance open for this procedure. - If request is for dental extraction, please clarify the # of teeth to be extracted. - If the patient is currently at the dentist's office, call Pre-Op Callback Staff (MA/nurse) to input urgent request.  - If the patient is not currently in the dentist office, please route to the Pre-Op pool.  Request for surgical clearance:  What type of surgery is being performed? RT REVERSE TOTAL SHOULDER ARTHROPLASTY  When is this surgery scheduled? 03-21-2021  What type of clearance is required (medical clearance vs. Pharmacy clearance to hold med vs. Both)? MEDICAL  Are there any medications that need to be held prior to surgery and how long? NONE LISTED  Practice name and name of physician performing surgery? [facility]  What is the office phone number? [phone]   7.   What is the office fax number? [phone]  8.   Anesthesia type (None, local, MAC, general) ? MAC/RBBB   Waylan Rocher 02/21/2021, 3:07 PM  _________________________________________________________________   (provider comments below)

## 2021-02-25 NOTE — Telephone Encounter (Signed)
Left message to call back 12:05 PM on 02/25/2021.  Patient needs call back.

## 2021-02-25 NOTE — Telephone Encounter (Signed)
Pt is retuning call to [employer]. Please advise pt further.

## 2021-02-26 NOTE — Telephone Encounter (Signed)
Patient returning call. She states to call: [phone]

## 2021-02-26 NOTE — Telephone Encounter (Signed)
   Primary Cardiologist: Olga Millers, MD  Chart reviewed as part of pre-operative protocol coverage. Given past medical history and time since last visit, based on ACC/AHA guidelines, Dominique Cruz would be at acceptable risk for the planned procedure without further cardiovascular testing.   Patient was advised that if she develops new symptoms prior to surgery to contact our office to arrange a follow-up appointment.  She verbalized understanding.  I will route this recommendation to the requesting party via Epic fax function and remove from pre-op pool.  Please call with questions.  Thomasene Ripple. Ezell Poke NP-C    02/26/2021, 4:08 PM [facility] [address] Office [phone] Fax [phone]

## 2021-02-26 NOTE — Telephone Encounter (Signed)
Left message to call back 10:07 AM on 02/26/2021.  Patient needs call back.

## 2021-02-28 DIAGNOSIS — M25512 Pain in left shoulder: Secondary | ICD-10-CM | POA: Diagnosis not present

## 2021-03-06 DIAGNOSIS — M25512 Pain in left shoulder: Secondary | ICD-10-CM | POA: Diagnosis not present

## 2021-03-14 DIAGNOSIS — M19011 Primary osteoarthritis, right shoulder: Secondary | ICD-10-CM | POA: Diagnosis not present

## 2021-03-14 DIAGNOSIS — Z471 Aftercare following joint replacement surgery: Secondary | ICD-10-CM | POA: Diagnosis not present

## 2021-03-14 DIAGNOSIS — M12811 Other specific arthropathies, not elsewhere classified, right shoulder: Secondary | ICD-10-CM | POA: Diagnosis not present

## 2021-03-14 DIAGNOSIS — Z96611 Presence of right artificial shoulder joint: Secondary | ICD-10-CM | POA: Diagnosis not present

## 2021-03-14 DIAGNOSIS — Z01818 Encounter for other preprocedural examination: Secondary | ICD-10-CM | POA: Diagnosis not present

## 2021-03-18 DIAGNOSIS — M12811 Other specific arthropathies, not elsewhere classified, right shoulder: Secondary | ICD-10-CM | POA: Diagnosis not present

## 2021-03-20 ENCOUNTER — Other Ambulatory Visit: Payer: Medicare PPO

## 2021-03-20 DIAGNOSIS — N3281 Overactive bladder: Secondary | ICD-10-CM | POA: Diagnosis not present

## 2021-03-20 DIAGNOSIS — K219 Gastro-esophageal reflux disease without esophagitis: Secondary | ICD-10-CM | POA: Diagnosis not present

## 2021-03-20 DIAGNOSIS — R600 Localized edema: Secondary | ICD-10-CM | POA: Diagnosis not present

## 2021-03-20 DIAGNOSIS — I340 Nonrheumatic mitral (valve) insufficiency: Secondary | ICD-10-CM | POA: Diagnosis not present

## 2021-03-20 DIAGNOSIS — M12811 Other specific arthropathies, not elsewhere classified, right shoulder: Secondary | ICD-10-CM | POA: Diagnosis not present

## 2021-03-20 DIAGNOSIS — Z01818 Encounter for other preprocedural examination: Secondary | ICD-10-CM | POA: Diagnosis not present

## 2021-03-20 DIAGNOSIS — I10 Essential (primary) hypertension: Secondary | ICD-10-CM | POA: Diagnosis not present

## 2021-03-20 DIAGNOSIS — Z86711 Personal history of pulmonary embolism: Secondary | ICD-10-CM | POA: Diagnosis not present

## 2021-03-21 DIAGNOSIS — G8918 Other acute postprocedural pain: Secondary | ICD-10-CM | POA: Diagnosis not present

## 2021-03-21 DIAGNOSIS — M25511 Pain in right shoulder: Secondary | ICD-10-CM | POA: Diagnosis not present

## 2021-03-21 DIAGNOSIS — Z471 Aftercare following joint replacement surgery: Secondary | ICD-10-CM | POA: Diagnosis not present

## 2021-03-21 DIAGNOSIS — K219 Gastro-esophageal reflux disease without esophagitis: Secondary | ICD-10-CM | POA: Diagnosis not present

## 2021-03-21 DIAGNOSIS — Z87891 Personal history of nicotine dependence: Secondary | ICD-10-CM | POA: Diagnosis not present

## 2021-03-21 DIAGNOSIS — Z7982 Long term (current) use of aspirin: Secondary | ICD-10-CM | POA: Diagnosis not present

## 2021-03-21 DIAGNOSIS — M25711 Osteophyte, right shoulder: Secondary | ICD-10-CM | POA: Diagnosis not present

## 2021-03-21 DIAGNOSIS — M12811 Other specific arthropathies, not elsewhere classified, right shoulder: Secondary | ICD-10-CM | POA: Diagnosis not present

## 2021-03-21 DIAGNOSIS — Z86711 Personal history of pulmonary embolism: Secondary | ICD-10-CM | POA: Diagnosis not present

## 2021-03-21 DIAGNOSIS — Z96611 Presence of right artificial shoulder joint: Secondary | ICD-10-CM | POA: Diagnosis not present

## 2021-03-21 DIAGNOSIS — I10 Essential (primary) hypertension: Secondary | ICD-10-CM | POA: Diagnosis not present

## 2021-03-21 DIAGNOSIS — M96621 Fracture of humerus following insertion of orthopedic implant, joint prosthesis, or bone plate, right arm: Secondary | ICD-10-CM | POA: Diagnosis not present

## 2021-03-21 DIAGNOSIS — Z01818 Encounter for other preprocedural examination: Secondary | ICD-10-CM | POA: Diagnosis not present

## 2021-03-21 DIAGNOSIS — G8929 Other chronic pain: Secondary | ICD-10-CM | POA: Diagnosis not present

## 2021-03-21 DIAGNOSIS — M75121 Complete rotator cuff tear or rupture of right shoulder, not specified as traumatic: Secondary | ICD-10-CM | POA: Diagnosis not present

## 2021-03-21 DIAGNOSIS — M19011 Primary osteoarthritis, right shoulder: Secondary | ICD-10-CM | POA: Diagnosis not present

## 2021-04-01 DIAGNOSIS — M25511 Pain in right shoulder: Secondary | ICD-10-CM | POA: Diagnosis not present

## 2021-04-01 DIAGNOSIS — M6281 Muscle weakness (generalized): Secondary | ICD-10-CM | POA: Diagnosis not present

## 2021-04-01 DIAGNOSIS — M25611 Stiffness of right shoulder, not elsewhere classified: Secondary | ICD-10-CM | POA: Diagnosis not present

## 2021-04-04 DIAGNOSIS — M25511 Pain in right shoulder: Secondary | ICD-10-CM | POA: Diagnosis not present

## 2021-04-04 DIAGNOSIS — M6281 Muscle weakness (generalized): Secondary | ICD-10-CM | POA: Diagnosis not present

## 2021-04-04 DIAGNOSIS — M25611 Stiffness of right shoulder, not elsewhere classified: Secondary | ICD-10-CM | POA: Diagnosis not present

## 2021-04-04 DIAGNOSIS — M25411 Effusion, right shoulder: Secondary | ICD-10-CM | POA: Diagnosis not present

## 2021-04-05 DIAGNOSIS — Z4889 Encounter for other specified surgical aftercare: Secondary | ICD-10-CM | POA: Diagnosis not present

## 2021-04-05 DIAGNOSIS — Z96611 Presence of right artificial shoulder joint: Secondary | ICD-10-CM | POA: Diagnosis not present

## 2021-04-05 DIAGNOSIS — M12811 Other specific arthropathies, not elsewhere classified, right shoulder: Secondary | ICD-10-CM | POA: Diagnosis not present

## 2021-04-09 DIAGNOSIS — M25511 Pain in right shoulder: Secondary | ICD-10-CM | POA: Diagnosis not present

## 2021-04-09 DIAGNOSIS — M25411 Effusion, right shoulder: Secondary | ICD-10-CM | POA: Diagnosis not present

## 2021-04-09 DIAGNOSIS — M6281 Muscle weakness (generalized): Secondary | ICD-10-CM | POA: Diagnosis not present

## 2021-04-09 DIAGNOSIS — M25611 Stiffness of right shoulder, not elsewhere classified: Secondary | ICD-10-CM | POA: Diagnosis not present

## 2021-04-12 DIAGNOSIS — M25611 Stiffness of right shoulder, not elsewhere classified: Secondary | ICD-10-CM | POA: Diagnosis not present

## 2021-04-12 DIAGNOSIS — M6281 Muscle weakness (generalized): Secondary | ICD-10-CM | POA: Diagnosis not present

## 2021-04-12 DIAGNOSIS — M25411 Effusion, right shoulder: Secondary | ICD-10-CM | POA: Diagnosis not present

## 2021-04-15 DIAGNOSIS — M6281 Muscle weakness (generalized): Secondary | ICD-10-CM | POA: Diagnosis not present

## 2021-04-15 DIAGNOSIS — M25611 Stiffness of right shoulder, not elsewhere classified: Secondary | ICD-10-CM | POA: Diagnosis not present

## 2021-04-15 DIAGNOSIS — M25411 Effusion, right shoulder: Secondary | ICD-10-CM | POA: Diagnosis not present

## 2021-04-16 DIAGNOSIS — Z20822 Contact with and (suspected) exposure to covid-19: Secondary | ICD-10-CM | POA: Diagnosis not present

## 2021-04-22 DIAGNOSIS — M25611 Stiffness of right shoulder, not elsewhere classified: Secondary | ICD-10-CM | POA: Diagnosis not present

## 2021-04-22 DIAGNOSIS — M6281 Muscle weakness (generalized): Secondary | ICD-10-CM | POA: Diagnosis not present

## 2021-04-22 DIAGNOSIS — M25411 Effusion, right shoulder: Secondary | ICD-10-CM | POA: Diagnosis not present

## 2021-04-24 ENCOUNTER — Ambulatory Visit: Payer: Medicare PPO | Admitting: Cardiology

## 2021-04-29 DIAGNOSIS — M25411 Effusion, right shoulder: Secondary | ICD-10-CM | POA: Diagnosis not present

## 2021-04-29 DIAGNOSIS — M25611 Stiffness of right shoulder, not elsewhere classified: Secondary | ICD-10-CM | POA: Diagnosis not present

## 2021-04-29 DIAGNOSIS — M6281 Muscle weakness (generalized): Secondary | ICD-10-CM | POA: Diagnosis not present

## 2021-05-06 DIAGNOSIS — M25511 Pain in right shoulder: Secondary | ICD-10-CM | POA: Diagnosis not present

## 2021-05-06 DIAGNOSIS — M25411 Effusion, right shoulder: Secondary | ICD-10-CM | POA: Diagnosis not present

## 2021-05-06 DIAGNOSIS — M25611 Stiffness of right shoulder, not elsewhere classified: Secondary | ICD-10-CM | POA: Diagnosis not present

## 2021-05-06 DIAGNOSIS — M6281 Muscle weakness (generalized): Secondary | ICD-10-CM | POA: Diagnosis not present

## 2021-05-08 DIAGNOSIS — M12811 Other specific arthropathies, not elsewhere classified, right shoulder: Secondary | ICD-10-CM | POA: Diagnosis not present

## 2021-05-08 DIAGNOSIS — Z96611 Presence of right artificial shoulder joint: Secondary | ICD-10-CM | POA: Diagnosis not present

## 2021-05-13 DIAGNOSIS — M25511 Pain in right shoulder: Secondary | ICD-10-CM | POA: Diagnosis not present

## 2021-05-13 DIAGNOSIS — M6281 Muscle weakness (generalized): Secondary | ICD-10-CM | POA: Diagnosis not present

## 2021-05-13 DIAGNOSIS — M25611 Stiffness of right shoulder, not elsewhere classified: Secondary | ICD-10-CM | POA: Diagnosis not present

## 2021-05-20 DIAGNOSIS — M25511 Pain in right shoulder: Secondary | ICD-10-CM | POA: Diagnosis not present

## 2021-05-20 DIAGNOSIS — M6281 Muscle weakness (generalized): Secondary | ICD-10-CM | POA: Diagnosis not present

## 2021-05-20 DIAGNOSIS — M25611 Stiffness of right shoulder, not elsewhere classified: Secondary | ICD-10-CM | POA: Diagnosis not present

## 2021-05-20 DIAGNOSIS — M25411 Effusion, right shoulder: Secondary | ICD-10-CM | POA: Diagnosis not present

## 2021-05-29 DIAGNOSIS — M25511 Pain in right shoulder: Secondary | ICD-10-CM | POA: Diagnosis not present

## 2021-06-03 ENCOUNTER — Other Ambulatory Visit (HOSPITAL_BASED_OUTPATIENT_CLINIC_OR_DEPARTMENT_OTHER): Payer: Self-pay

## 2021-06-03 MED ORDER — AMOXICILLIN 500 MG PO CAPS
ORAL_CAPSULE | ORAL | 0 refills | Status: DC
  Filled 2021-06-03: qty 21, 7d supply, fill #0

## 2021-06-04 DIAGNOSIS — Z7722 Contact with and (suspected) exposure to environmental tobacco smoke (acute) (chronic): Secondary | ICD-10-CM | POA: Diagnosis not present

## 2021-06-04 DIAGNOSIS — I10 Essential (primary) hypertension: Secondary | ICD-10-CM | POA: Diagnosis not present

## 2021-06-04 DIAGNOSIS — K219 Gastro-esophageal reflux disease without esophagitis: Secondary | ICD-10-CM | POA: Diagnosis not present

## 2021-06-04 DIAGNOSIS — G8929 Other chronic pain: Secondary | ICD-10-CM | POA: Diagnosis not present

## 2021-06-04 DIAGNOSIS — M1990 Unspecified osteoarthritis, unspecified site: Secondary | ICD-10-CM | POA: Diagnosis not present

## 2021-06-04 DIAGNOSIS — J309 Allergic rhinitis, unspecified: Secondary | ICD-10-CM | POA: Diagnosis not present

## 2021-06-04 DIAGNOSIS — R32 Unspecified urinary incontinence: Secondary | ICD-10-CM | POA: Diagnosis not present

## 2021-06-04 DIAGNOSIS — M8580 Other specified disorders of bone density and structure, unspecified site: Secondary | ICD-10-CM | POA: Diagnosis not present

## 2021-06-04 DIAGNOSIS — M810 Age-related osteoporosis without current pathological fracture: Secondary | ICD-10-CM | POA: Diagnosis not present

## 2021-06-06 DIAGNOSIS — M25511 Pain in right shoulder: Secondary | ICD-10-CM | POA: Diagnosis not present

## 2021-06-17 DIAGNOSIS — E7800 Pure hypercholesterolemia, unspecified: Secondary | ICD-10-CM | POA: Diagnosis not present

## 2021-06-17 DIAGNOSIS — M25511 Pain in right shoulder: Secondary | ICD-10-CM | POA: Diagnosis not present

## 2021-06-17 DIAGNOSIS — K219 Gastro-esophageal reflux disease without esophagitis: Secondary | ICD-10-CM | POA: Diagnosis not present

## 2021-06-17 DIAGNOSIS — Z86711 Personal history of pulmonary embolism: Secondary | ICD-10-CM | POA: Diagnosis not present

## 2021-06-17 DIAGNOSIS — Z0000 Encounter for general adult medical examination without abnormal findings: Secondary | ICD-10-CM | POA: Diagnosis not present

## 2021-06-17 DIAGNOSIS — I10 Essential (primary) hypertension: Secondary | ICD-10-CM | POA: Diagnosis not present

## 2021-06-17 DIAGNOSIS — M1990 Unspecified osteoarthritis, unspecified site: Secondary | ICD-10-CM | POA: Diagnosis not present

## 2021-06-17 DIAGNOSIS — R7309 Other abnormal glucose: Secondary | ICD-10-CM | POA: Diagnosis not present

## 2021-06-17 DIAGNOSIS — M810 Age-related osteoporosis without current pathological fracture: Secondary | ICD-10-CM | POA: Diagnosis not present

## 2021-06-17 DIAGNOSIS — I700 Atherosclerosis of aorta: Secondary | ICD-10-CM | POA: Diagnosis not present

## 2021-06-17 DIAGNOSIS — Z1389 Encounter for screening for other disorder: Secondary | ICD-10-CM | POA: Diagnosis not present

## 2021-06-17 DIAGNOSIS — I340 Nonrheumatic mitral (valve) insufficiency: Secondary | ICD-10-CM | POA: Diagnosis not present

## 2021-06-19 DIAGNOSIS — Z96611 Presence of right artificial shoulder joint: Secondary | ICD-10-CM | POA: Diagnosis not present

## 2021-06-19 DIAGNOSIS — M12811 Other specific arthropathies, not elsewhere classified, right shoulder: Secondary | ICD-10-CM | POA: Diagnosis not present

## 2021-06-19 DIAGNOSIS — M75112 Incomplete rotator cuff tear or rupture of left shoulder, not specified as traumatic: Secondary | ICD-10-CM | POA: Diagnosis not present

## 2021-06-24 NOTE — Progress Notes (Signed)
HPI: Follow-up mitral regurgitation. 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  Most recent echocardiogram May 2022 showed normal LV function, moderate pulmonary hypertension, prolapse of the posterior mitral valve leaflet with severe eccentric anteriorly directed mitral regurgitation, mild to moderate tricuspid regurgitation.  Since last seen she denies dyspnea on exertion, orthopnea, PND, pedal edema, chest pain, palpitations or syncope.  Current Outpatient Medications  Medication Sig Dispense Refill   acetaminophen (TYLENOL) 650 MG CR tablet Take 650 mg by mouth every 8 (eight) hours as needed for pain.     amoxicillin (AMOXIL) 500 MG capsule Take 1 capsule by mouth 3 times daily until gone 21 capsule 0   ARTIFICIAL TEAR OP Place 1 drop into both eyes 2 (two) times daily.     aspirin 81 MG tablet Take 81 mg by mouth daily.     B Complex-C (SUPER B COMPLEX PO) Take 1 tablet by mouth daily.      BIOTIN PO Take 1 capsule by mouth daily.      Calcium Carbonate-Vitamin D 600-400 MG-UNIT tablet Take 1 tablet by mouth daily.     Cholecalciferol (VITAMIN D3) 10 MCG (400 UNIT) tablet Take 400 Units by mouth daily.     Coenzyme Q10 (CO Q 10) 100 MG CAPS Take 100 mg by mouth daily.     fish oil-omega-3 fatty acids 1000 MG capsule Take 1 g by mouth daily.     Folic Acid (FOLATE PO) Take 935 mcg by mouth daily.     GLUCOSAMINE-CHONDROITIN PO Take 1,500 mg by mouth daily.      L-ARGININE PO Take 1,500 mg by mouth 2 (two) times daily.     LYSINE PO Take 1,000 mg by mouth daily.      magnesium oxide (MAG-OX) 400 MG tablet Take 500 mg by mouth daily.     MELATONIN PO Take 6 mg by mouth at bedtime.      Misc Natural Products (TART CHERRY ADVANCED PO) Take 1,000 mg by mouth daily.       Multiple Vitamin (MULTIVITAMIN) tablet Take 1 tablet by mouth daily.     olmesartan (BENICAR) 20 MG tablet Take 20 mg by mouth daily.      oxybutynin (DITROPAN) 5 MG tablet Take 5 mg by mouth daily.      pantoprazole (PROTONIX) 20 MG tablet Take 20 mg by mouth daily.     Quercetin 250 MG TABS Take 250 mg by mouth daily.     vitamin B-12 (CYANOCOBALAMIN) 1000 MCG tablet Take 1,000 mcg by mouth daily.     vitamin E 400 UNIT capsule Take 400 Units by mouth daily.     fluticasone (FLONASE) 50 MCG/ACT nasal spray Place 2 sprays into both nostrils daily. (Patient not taking: Reported on 07/03/2021)     No current facility-administered medications for this visit.     Past Medical History:  Diagnosis Date   Anemia    GERD (gastroesophageal reflux disease)    History of chicken pox    Hypertension    Mumps    Overactive bladder    PE (pulmonary embolism)    2012   Poison ivy    both arms healing well on prednisone    Scoliosis    Swelling    FEET AND LEGS B/L   Weakness of left leg  Past Surgical History:  Procedure Laterality Date   COLONOSCOPY WITH PROPOFOL N/A 11/24/2016   Procedure: COLONOSCOPY WITH PROPOFOL;  Surgeon: Garlan Fair, MD;  Location: [facility] ENDOS[facility];  Service: Endoscopy;  Laterality: N/A;   FOOT SURGERY Bilateral    BUNION   FOOT SURGERY Right    TENDON right ankle   KNEE SURGERY Right    arthrscopy and meniscus repair   TEE WITHOUT CARDIOVERSION N/A 09/30/2019   Procedure: TRANSESOPHAGEAL ECHOCARDIOGRAM (TEE);  Surgeon: Lelon Perla, MD;  Location: [facility] ENDOSCOPY;  Service: Cardiovascular;  Laterality: N/A;    Social History   Socioeconomic History   Marital status: Married    Spouse name: Not on file   Number of children: 2   Years of education: Not on file   Highest education level: Not on file  Occupational History   Occupation: Retired  Tobacco Use   Smoking status: Former    Types: Cigarettes    Quit date: 05/11/1961    Years since  quitting: 60.1   Smokeless tobacco: Never   Tobacco comments:    smoked few years  Substance and Sexual Activity   Alcohol use: Yes    Alcohol/week: 0.0 standard drinks    Comment: weekly   Drug use: No   Sexual activity: Not on file  Other Topics Concern   Not on file  Social History Narrative   Not on file   Social Determinants of Health   Financial Resource Strain: Not on file  Food Insecurity: Not on file  Transportation Needs: Not on file  Physical Activity: Not on file  Stress: Not on file  Social Connections: Not on file  Intimate Partner Violence: Not on file    Family History  Problem Relation Age of Onset   Arthritis Father    Heart disease Father    Allergies Father    Colon cancer Mother    Heart disease Mother    Mental illness Mother    Allergies Mother    Multiple sclerosis Brother    Prostate cancer Brother    Stroke Brother    Healthy Son        x1   Healthy Daughter        x1    ROS: no fevers or chills, productive cough, hemoptysis, dysphasia, odynophagia, melena, hematochezia, dysuria, hematuria, rash, seizure activity, orthopnea, PND, pedal edema, claudication. Remaining systems are negative.  Physical Exam: Well-developed well-nourished in no acute distress.  Skin is warm and dry.  HEENT is normal.  Neck is supple.  Chest is clear to auscultation with normal expansion.  Cardiovascular exam is regular rate and rhythm.  Abdominal exam nontender or distended. No masses palpated. Extremities show no edema. neuro grossly intact  A/P  1 severe mitral regurgitation secondary to prolapse of P2-patient remains asymptomatic.  We again reviewed the options today including early repair to avoid complications including LV dysfunction and atrial fibrillation versus follow-up echocardiograms and proceeding if she develops symptoms, left ventricular enlargement or decreased LV function.  I have recommended early repair.  She is somewhat hesitant and is  also considering having her left shoulder replaced.  She would like to repeat her echocardiogram in May which will be a year from her most recent.  We will then see her immediately after and decide about whether we will proceed with repair at that time we will continue to follow serial echocardiograms.  She will contact us if she develops any symptoms.  2 hypertension-patient's blood pressure is  controlled today.  Continue present medications and follow.  Olga Millers, MD

## 2021-06-25 DIAGNOSIS — M25511 Pain in right shoulder: Secondary | ICD-10-CM | POA: Diagnosis not present

## 2021-07-02 DIAGNOSIS — M25511 Pain in right shoulder: Secondary | ICD-10-CM | POA: Diagnosis not present

## 2021-07-03 ENCOUNTER — Ambulatory Visit: Payer: Medicare PPO | Admitting: Cardiology

## 2021-07-03 ENCOUNTER — Other Ambulatory Visit: Payer: Self-pay

## 2021-07-03 ENCOUNTER — Encounter: Payer: Self-pay | Admitting: Cardiology

## 2021-07-03 VITALS — BP 138/80 | HR 66 | Ht 62.0 in | Wt 156.1 lb

## 2021-07-03 DIAGNOSIS — I340 Nonrheumatic mitral (valve) insufficiency: Secondary | ICD-10-CM | POA: Diagnosis not present

## 2021-07-03 DIAGNOSIS — I10 Essential (primary) hypertension: Secondary | ICD-10-CM

## 2021-07-03 NOTE — Patient Instructions (Signed)
  Testing/Procedures:  Your physician has requested that you have an echocardiogram. Echocardiography is a painless test that uses sound waves to create images of your heart. It provides your doctor with information about the size and shape of your heart and how well your heart's chambers and valves are working. This procedure takes approximately one hour. There are no restrictions for this procedure. [facility]EDULE IN MAY 2023   Follow-Up: At [facility], you and your health needs are our priority.  As part of our continuing mission to provide you with exceptional heart care, we have created designated Provider Care Teams.  These Care Teams include your primary Cardiologist (physician) and Advanced Practice Providers (APPs -  Physician Assistants and Nurse Practitioners) who all work together to provide you with the care you need, when you need it.  We recommend signing up for the patient portal called "MyChart".  Sign up information is provided on this After Visit Summary.  MyChart is used to connect with patients for Virtual Visits (Telemedicine).  Patients are able to view lab/test results, encounter notes, upcoming appointments, etc.  Non-urgent messages can be sent to your provider as well.   To learn more about what you can do with MyChart, go to [email or web address].    Your next appointment:   6 month(s)  The format for your next appointment:   In Person  Provider:   Olga Millers, MD    Dr Jonni Sanger AND DR JEFFERY GACA

## 2021-07-04 ENCOUNTER — Encounter: Payer: Self-pay | Admitting: Cardiology

## 2021-07-04 DIAGNOSIS — I340 Nonrheumatic mitral (valve) insufficiency: Secondary | ICD-10-CM

## 2021-07-09 DIAGNOSIS — I10 Essential (primary) hypertension: Secondary | ICD-10-CM | POA: Diagnosis not present

## 2021-07-10 DIAGNOSIS — M25511 Pain in right shoulder: Secondary | ICD-10-CM | POA: Diagnosis not present

## 2021-07-16 DIAGNOSIS — M25511 Pain in right shoulder: Secondary | ICD-10-CM | POA: Diagnosis not present

## 2021-08-08 DIAGNOSIS — M25511 Pain in right shoulder: Secondary | ICD-10-CM | POA: Diagnosis not present

## 2021-08-12 ENCOUNTER — Other Ambulatory Visit (HOSPITAL_BASED_OUTPATIENT_CLINIC_OR_DEPARTMENT_OTHER): Payer: Medicare PPO

## 2021-08-14 DIAGNOSIS — M25511 Pain in right shoulder: Secondary | ICD-10-CM | POA: Diagnosis not present

## 2021-08-15 ENCOUNTER — Other Ambulatory Visit (HOSPITAL_BASED_OUTPATIENT_CLINIC_OR_DEPARTMENT_OTHER): Payer: Self-pay

## 2021-08-16 DIAGNOSIS — H26493 Other secondary cataract, bilateral: Secondary | ICD-10-CM | POA: Diagnosis not present

## 2021-08-16 DIAGNOSIS — H35033 Hypertensive retinopathy, bilateral: Secondary | ICD-10-CM | POA: Diagnosis not present

## 2021-08-16 DIAGNOSIS — H524 Presbyopia: Secondary | ICD-10-CM | POA: Diagnosis not present

## 2021-08-19 ENCOUNTER — Other Ambulatory Visit (HOSPITAL_BASED_OUTPATIENT_CLINIC_OR_DEPARTMENT_OTHER): Payer: Self-pay

## 2021-08-20 DIAGNOSIS — M25511 Pain in right shoulder: Secondary | ICD-10-CM | POA: Diagnosis not present

## 2021-08-27 DIAGNOSIS — M25511 Pain in right shoulder: Secondary | ICD-10-CM | POA: Diagnosis not present

## 2021-09-02 IMAGING — MG MM DIGITAL DIAGNOSTIC UNILAT*R* W/ TOMO W/ CAD
6 series · 6 of 18 positions shown · non-contrast
Comparison: Previous exams.

CLINICAL DATA: Screening recall for possible mass seen in the far
posterior right breast on the MLO view only.

EXAM:
DIGITAL DIAGNOSTIC UNILATERAL RIGHT MAMMOGRAM WITH TOMOSYNTHESIS AND
CAD
TECHNIQUE: Right digital diagnostic mammography and breast tomosynthesis was
performed. The images were evaluated with computer-aided detection.

[R MLO synth-2D]
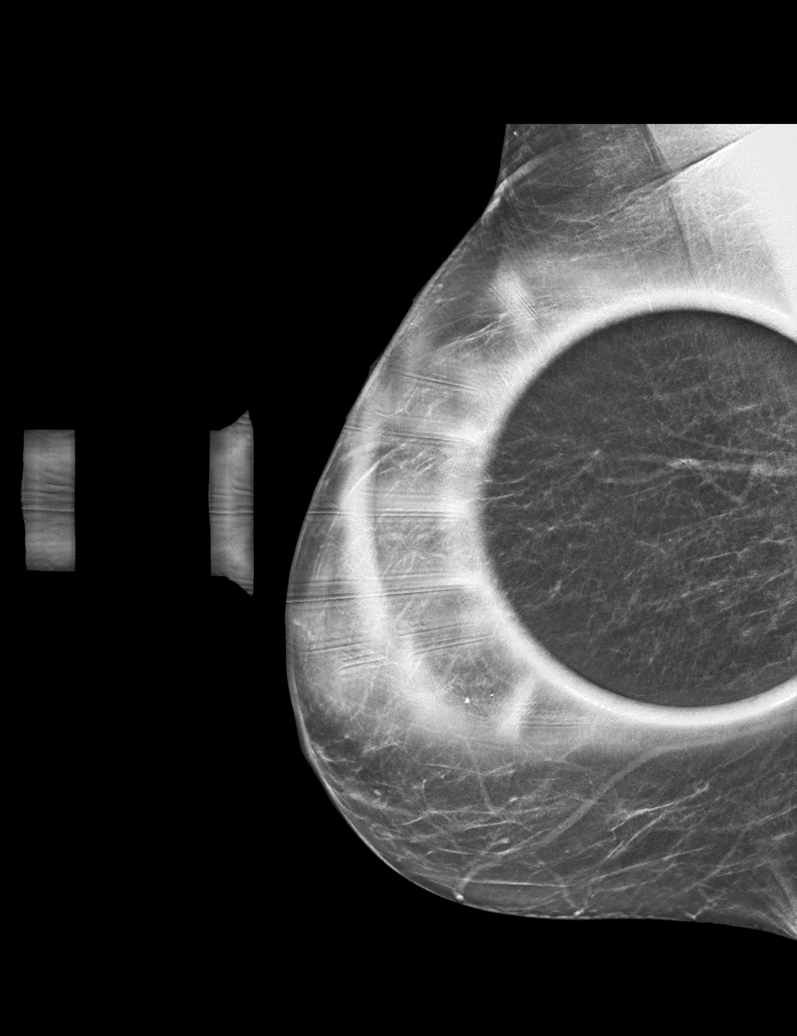

[R ML synth-2D]
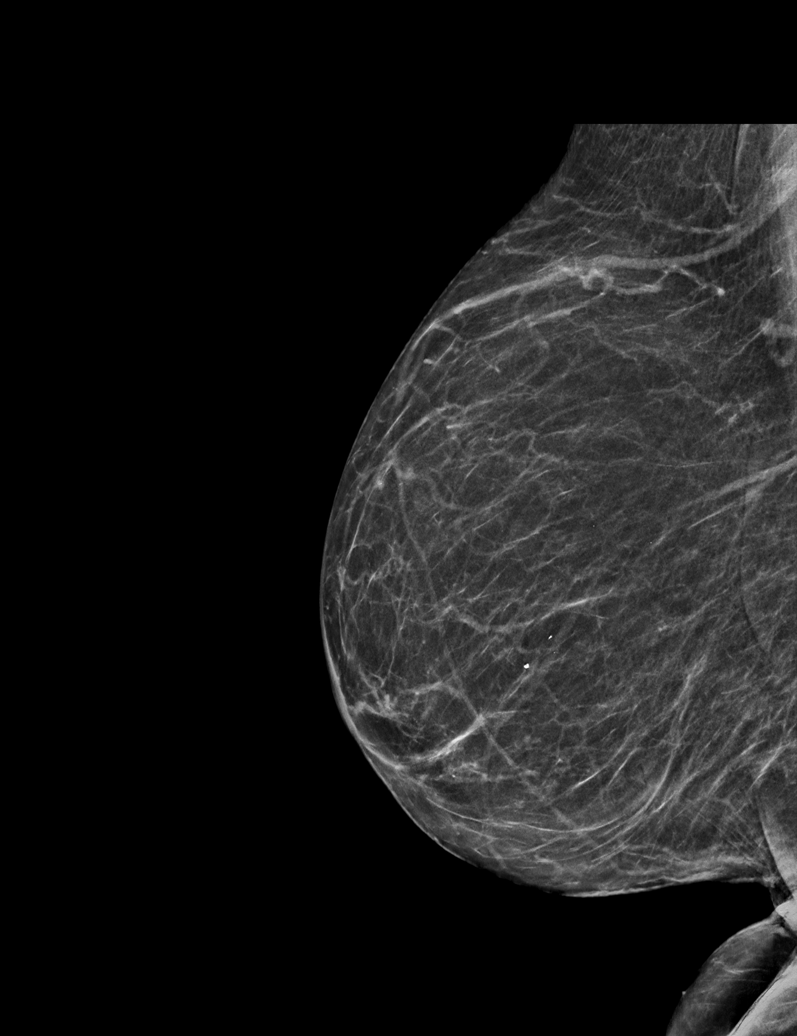

[R XCCL synth-2D]
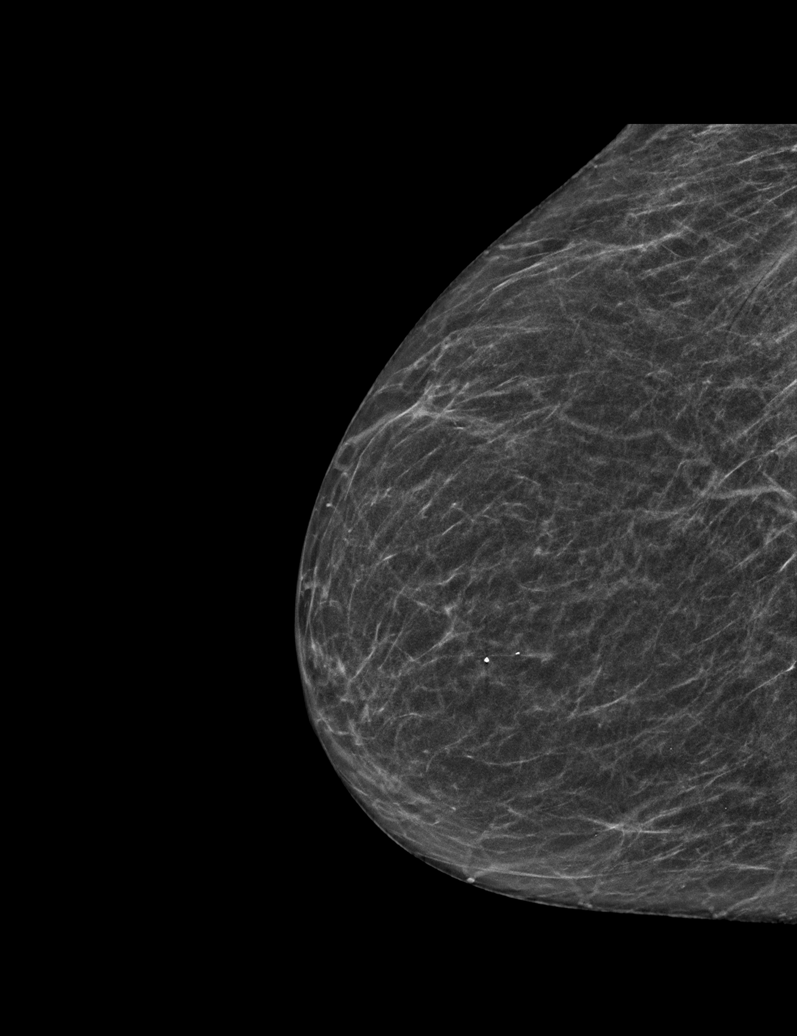

[R MLO tomo · tomo slice 23/44.0]
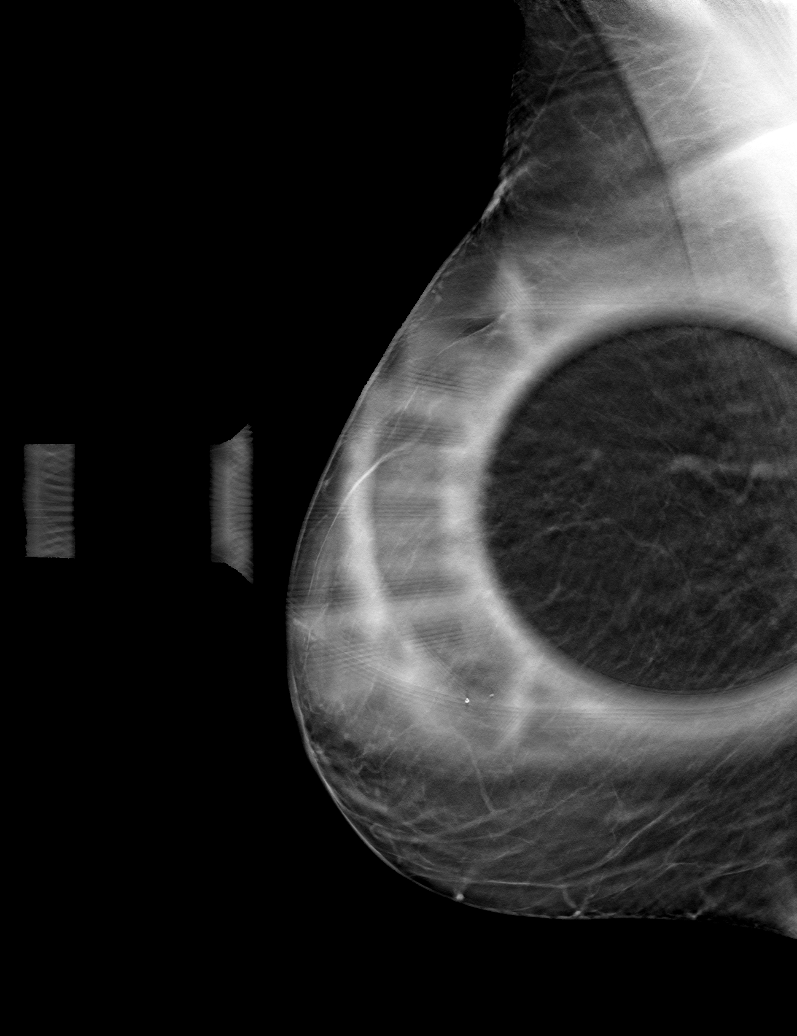

[R ML tomo · tomo slice 25/50.0]
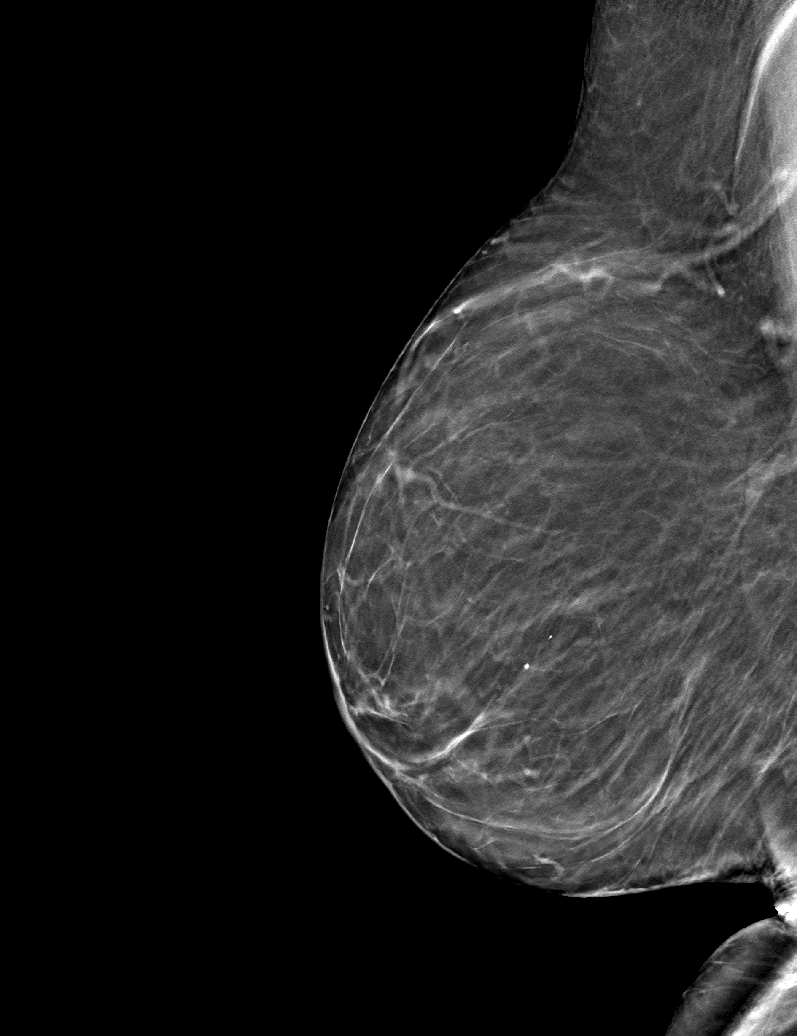

[R XCCL tomo · tomo slice 23/44.0]
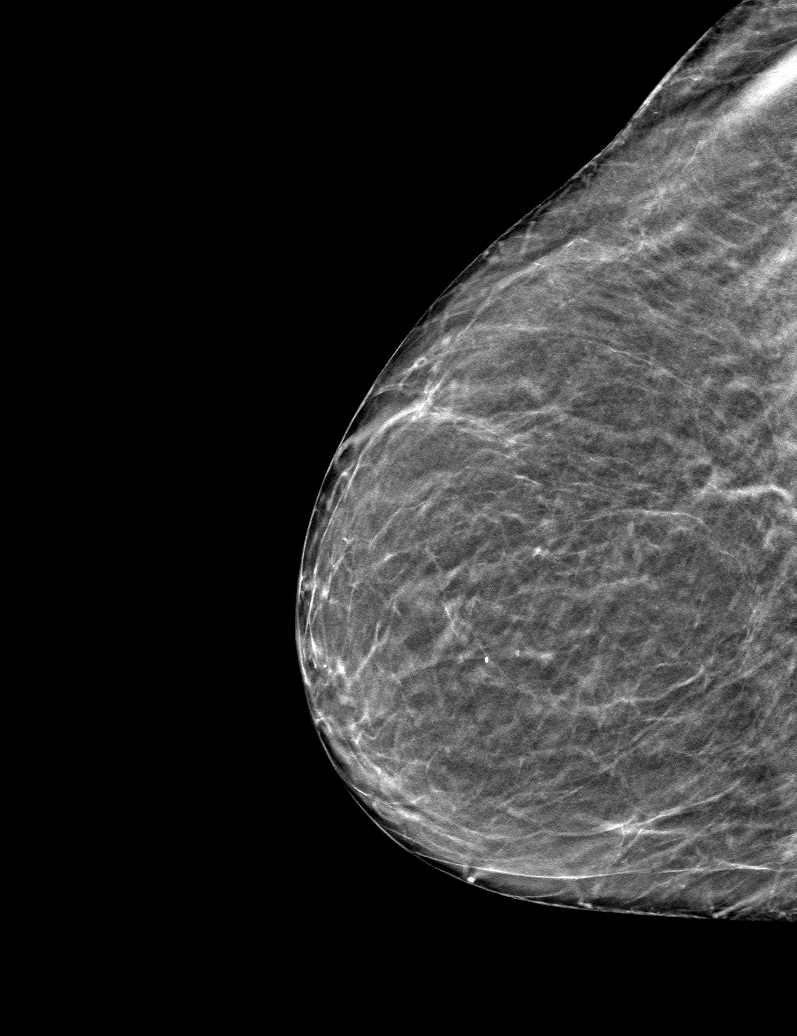

[6 of 18 positions shown; findings below may reference images not displayed]

ACR Breast Density Category b: There are scattered areas of
fibroglandular density.
FINDINGS: Additional tomograms were performed of the right breast. The
initially questioned possible right breast mass is tubular and
consistent with a blood vessel on the additional ML tomograms. There
is no mammographic evidence of malignancy in the right breast.
IMPRESSION: No mammographic evidence of malignancy in the right breast.

RECOMMENDATION:
Screening mammogram in one year.(Code:[id number])

I have discussed the findings and recommendations with the patient.
If applicable, a reminder letter will be sent to the patient
regarding the next appointment.

BI-RADS CATEGORY  1: Negative.

## 2021-09-03 DIAGNOSIS — M25511 Pain in right shoulder: Secondary | ICD-10-CM | POA: Diagnosis not present

## 2021-09-10 DIAGNOSIS — M25511 Pain in right shoulder: Secondary | ICD-10-CM | POA: Diagnosis not present

## 2021-09-11 ENCOUNTER — Other Ambulatory Visit (HOSPITAL_BASED_OUTPATIENT_CLINIC_OR_DEPARTMENT_OTHER): Payer: Self-pay

## 2021-09-17 DIAGNOSIS — M25511 Pain in right shoulder: Secondary | ICD-10-CM | POA: Diagnosis not present

## 2021-09-24 DIAGNOSIS — M25511 Pain in right shoulder: Secondary | ICD-10-CM | POA: Diagnosis not present

## 2021-09-25 DIAGNOSIS — M75112 Incomplete rotator cuff tear or rupture of left shoulder, not specified as traumatic: Secondary | ICD-10-CM | POA: Diagnosis not present

## 2021-09-25 DIAGNOSIS — Z96611 Presence of right artificial shoulder joint: Secondary | ICD-10-CM | POA: Diagnosis not present

## 2021-09-25 DIAGNOSIS — I700 Atherosclerosis of aorta: Secondary | ICD-10-CM | POA: Diagnosis not present

## 2021-09-25 DIAGNOSIS — M12811 Other specific arthropathies, not elsewhere classified, right shoulder: Secondary | ICD-10-CM | POA: Diagnosis not present

## 2021-10-18 DIAGNOSIS — M75112 Incomplete rotator cuff tear or rupture of left shoulder, not specified as traumatic: Secondary | ICD-10-CM | POA: Diagnosis not present

## 2021-10-18 DIAGNOSIS — M75110 Incomplete rotator cuff tear or rupture of unspecified shoulder, not specified as traumatic: Secondary | ICD-10-CM | POA: Diagnosis not present

## 2021-10-18 DIAGNOSIS — M19012 Primary osteoarthritis, left shoulder: Secondary | ICD-10-CM | POA: Diagnosis not present

## 2021-12-02 ENCOUNTER — Ambulatory Visit (HOSPITAL_BASED_OUTPATIENT_CLINIC_OR_DEPARTMENT_OTHER)
Admission: RE | Admit: 2021-12-02 | Discharge: 2021-12-02 | Disposition: A | Discharge status: Home / Self Care | Payer: Medicare PPO | Source: Ambulatory Visit | Attending: Cardiology | Admitting: Cardiology

## 2021-12-02 DIAGNOSIS — I340 Nonrheumatic mitral (valve) insufficiency: Secondary | ICD-10-CM | POA: Diagnosis not present

## 2021-12-02 LAB — ECHOCARDIOGRAM COMPLETE
Area-P 1/2: 3.53 cm2
MV M vel: 4.79 m/s
MV Peak grad: 91.8 mmHg
Radius: 0.8 cm
S' Lateral: 2.9 cm

## 2021-12-02 NOTE — Progress Notes (Signed)
?  Echocardiogram ?2D Echocardiogram has been performed. ? ?Dominique Cruz ?12/02/2021, 10:47 AM ?

## 2021-12-05 ENCOUNTER — Encounter: Payer: Self-pay | Admitting: *Deleted

## 2021-12-25 DIAGNOSIS — M12811 Other specific arthropathies, not elsewhere classified, right shoulder: Secondary | ICD-10-CM | POA: Diagnosis not present

## 2021-12-25 DIAGNOSIS — Z96611 Presence of right artificial shoulder joint: Secondary | ICD-10-CM | POA: Diagnosis not present

## 2021-12-26 NOTE — Progress Notes (Signed)
HPI: Follow-up mitral regurgitation. 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  Echocardiogram May 2023 showed normal LV function, grade 1 diastolic dysfunction, moderate left atrial enlargement, moderate to severe mitral regurgitation due to prolapse of posterior mitral valve leaflet, mild aortic insufficiency.  Since last seen she denies dyspnea, chest pain, palpitations or syncope.  Minimal pedal edema.  Current Outpatient Medications  Medication Sig Dispense Refill   acetaminophen (TYLENOL) 650 MG CR tablet Take 650 mg by mouth every 8 (eight) hours as needed for pain.     amoxicillin (AMOXIL) 500 MG capsule Take 1 capsule by mouth 3 times daily until gone 21 capsule 0   ARTIFICIAL TEAR OP Place 1 drop into both eyes 2 (two) times daily.     aspirin 81 MG tablet Take 81 mg by mouth daily.     B Complex-C (SUPER B COMPLEX PO) Take 1 tablet by mouth daily.      BIOTIN PO Take 1 capsule by mouth daily.      Calcium Carbonate-Vitamin D 600-400 MG-UNIT tablet Take 1 tablet by mouth daily.     Cholecalciferol (VITAMIN D3) 10 MCG (400 UNIT) tablet Take 400 Units by mouth daily.     Coenzyme Q10 (CO Q 10) 100 MG CAPS Take 100 mg by mouth daily.     fish oil-omega-3 fatty acids 1000 MG capsule Take 1 g by mouth daily.     fluticasone (FLONASE) 50 MCG/ACT nasal spray Place 2 sprays into both nostrils daily.     Folic Acid (FOLATE PO) Take 935 mcg by mouth daily.     GLUCOSAMINE-CHONDROITIN PO Take 1,500 mg by mouth daily.      ibuprofen (ADVIL) 800 MG tablet Take 800 mg by mouth as needed.     L-ARGININE PO Take 1,500 mg by mouth 2 (two) times daily.     LYSINE PO Take 1,000 mg by mouth daily.      magnesium oxide (MAG-OX) 400 MG tablet Take 500 mg by mouth daily.     MELATONIN PO  Take 6 mg by mouth at bedtime.      Misc Natural Products (TART CHERRY ADVANCED PO) Take 1,000 mg by mouth daily.      Multiple Vitamin (MULTIVITAMIN) tablet Take 1 tablet by mouth daily.     olmesartan (BENICAR) 20 MG tablet Take 20 mg by mouth daily.      oxybutynin (DITROPAN) 5 MG tablet Take 5 mg by mouth daily.      pantoprazole (PROTONIX) 20 MG tablet Take 20 mg by mouth daily.     Quercetin 250 MG TABS Take 250 mg by mouth daily.     vitamin B-12 (CYANOCOBALAMIN) 1000 MCG tablet Take 1,000 mcg by mouth daily.     vitamin E 400 UNIT capsule Take 400 Units by mouth daily.     No current facility-administered medications for this visit.     Past Medical History:  Diagnosis Date   Anemia    GERD (gastroesophageal reflux disease)    History of chicken pox    Hypertension    Mumps    Overactive bladder    PE (pulmonary embolism)    2012   Poison ivy    both arms healing well on prednisone    Scoliosis    Swelling    FEET AND LEGS B/L  Weakness of left leg     Past Surgical History:  Procedure Laterality Date   COLONOSCOPY WITH PROPOFOL N/A 11/24/2016   Procedure: COLONOSCOPY WITH PROPOFOL;  Surgeon: Charolett Bumpers, MD;  Location: [facility] ENDOSCOPY;  Service: Endoscopy;  Laterality: N/A;   FOOT SURGERY Bilateral    BUNION   FOOT SURGERY Right    TENDON right ankle   KNEE SURGERY Right    arthrscopy and meniscus repair   TEE WITHOUT CARDIOVERSION N/A 09/30/2019   Procedure: TRANSESOPHAGEAL ECHOCARDIOGRAM (TEE);  Surgeon: Lewayne Bunting, MD;  Location: [facility] ENDOSCOPY;  Service: Cardiovascular;  Laterality: N/A;    Social History   Socioeconomic History   Marital status: Married    Spouse name: Not on file   Number of children: 2   Years of education: Not on file   Highest education level: Not on file  Occupational History   Occupation: Retired  Tobacco Use   Smoking status: Former    Types: Cigarettes    Quit date: 05/11/1961    Years since quitting: 60.6    Smokeless tobacco: Never   Tobacco comments:    smoked few years  Substance and Sexual Activity   Alcohol use: Yes    Alcohol/week: 0.0 standard drinks    Comment: weekly   Drug use: No   Sexual activity: Not on file  Other Topics Concern   Not on file  Social History Narrative   Not on file   Social Determinants of Health   Financial Resource Strain: Not on file  Food Insecurity: Not on file  Transportation Needs: Not on file  Physical Activity: Not on file  Stress: Not on file  Social Connections: Not on file  Intimate Partner Violence: Not on file    Family History  Problem Relation Age of Onset   Arthritis Father    Heart disease Father    Allergies Father    Colon cancer Mother    Heart disease Mother    Mental illness Mother    Allergies Mother    Multiple sclerosis Brother    Prostate cancer Brother    Stroke Brother    Healthy Son        x1   Healthy Daughter        x1    ROS: no fevers or chills, productive cough, hemoptysis, dysphasia, odynophagia, melena, hematochezia, dysuria, hematuria, rash, seizure activity, orthopnea, PND, pedal edema, claudication. Remaining systems are negative.  Physical Exam: Well-developed well-nourished in no acute distress.  Skin is warm and dry.  HEENT is normal.  Neck is supple.  Chest is clear to auscultation with normal expansion.  Cardiovascular exam is regular rate and rhythm.  2/6 systolic murmur apex. Abdominal exam nontender or distended. No masses palpated. Extremities show trace edema. neuro grossly intact  ECG-normal sinus rhythm at a rate of 64, no ST changes.  Personally reviewed  A/P  1 prolapse of P2 with severe mitral regurgitation-as outlined in previous notes she has severe mitral regurgitation.  We have discussed options of early repair (to avoid potential LV dysfunction or atrial fibrillation) versus follow-up echocardiograms and proceeding if she develops symptoms, LV dysfunction or left  ventricular enlargement.  Ordinarily I would recommend the former.  However she is 81 years old and is also somewhat hesitant.  She would like to be conservative which I think is reasonable.  We will plan follow-up echocardiogram May 2024 or sooner if necessary.  I will see her back in 6 months to  make sure her symptoms are stable.  Note she would consider proceeding with surgery if she develops symptoms, LV dysfunction or left ventricular enlargement.  2 hypertension-patient's blood pressure is controlled today.  Continue present medical regimen.  Olga Millers, MD

## 2022-01-01 ENCOUNTER — Encounter: Payer: Self-pay | Admitting: Cardiology

## 2022-01-01 ENCOUNTER — Ambulatory Visit: Payer: Medicare PPO | Admitting: Cardiology

## 2022-01-01 VITALS — BP 140/81 | HR 64 | Ht 62.0 in | Wt 162.0 lb

## 2022-01-01 DIAGNOSIS — I340 Nonrheumatic mitral (valve) insufficiency: Secondary | ICD-10-CM | POA: Diagnosis not present

## 2022-01-01 DIAGNOSIS — I10 Essential (primary) hypertension: Secondary | ICD-10-CM | POA: Diagnosis not present

## 2022-01-01 NOTE — Patient Instructions (Signed)

## 2022-01-27 DIAGNOSIS — Z01419 Encounter for gynecological examination (general) (routine) without abnormal findings: Secondary | ICD-10-CM | POA: Diagnosis not present

## 2022-01-27 DIAGNOSIS — Z1231 Encounter for screening mammogram for malignant neoplasm of breast: Secondary | ICD-10-CM | POA: Diagnosis not present

## 2022-01-27 DIAGNOSIS — N3945 Continuous leakage: Secondary | ICD-10-CM | POA: Diagnosis not present

## 2022-01-27 DIAGNOSIS — Z6830 Body mass index (BMI) 30.0-30.9, adult: Secondary | ICD-10-CM | POA: Diagnosis not present

## 2022-03-05 DIAGNOSIS — R152 Fecal urgency: Secondary | ICD-10-CM | POA: Diagnosis not present

## 2022-03-05 DIAGNOSIS — M6289 Other specified disorders of muscle: Secondary | ICD-10-CM | POA: Diagnosis not present

## 2022-03-05 DIAGNOSIS — M6281 Muscle weakness (generalized): Secondary | ICD-10-CM | POA: Diagnosis not present

## 2022-03-05 DIAGNOSIS — M62838 Other muscle spasm: Secondary | ICD-10-CM | POA: Diagnosis not present

## 2022-03-05 DIAGNOSIS — N3941 Urge incontinence: Secondary | ICD-10-CM | POA: Diagnosis not present

## 2022-03-18 DIAGNOSIS — M6281 Muscle weakness (generalized): Secondary | ICD-10-CM | POA: Diagnosis not present

## 2022-03-18 DIAGNOSIS — M62838 Other muscle spasm: Secondary | ICD-10-CM | POA: Diagnosis not present

## 2022-03-18 DIAGNOSIS — M6289 Other specified disorders of muscle: Secondary | ICD-10-CM | POA: Diagnosis not present

## 2022-03-18 DIAGNOSIS — N3946 Mixed incontinence: Secondary | ICD-10-CM | POA: Diagnosis not present

## 2022-03-19 DIAGNOSIS — M79604 Pain in right leg: Secondary | ICD-10-CM | POA: Diagnosis not present

## 2022-03-24 ENCOUNTER — Emergency Department (HOSPITAL_BASED_OUTPATIENT_CLINIC_OR_DEPARTMENT_OTHER): Payer: Medicare PPO

## 2022-03-24 ENCOUNTER — Emergency Department (HOSPITAL_BASED_OUTPATIENT_CLINIC_OR_DEPARTMENT_OTHER)
Admission: EM | Admit: 2022-03-24 | Discharge: 2022-03-24 | Disposition: A | Discharge status: Home / Self Care | Payer: Medicare PPO | Attending: Emergency Medicine | Admitting: Emergency Medicine

## 2022-03-24 ENCOUNTER — Encounter (HOSPITAL_BASED_OUTPATIENT_CLINIC_OR_DEPARTMENT_OTHER): Payer: Self-pay | Admitting: Emergency Medicine

## 2022-03-24 DIAGNOSIS — M25511 Pain in right shoulder: Secondary | ICD-10-CM | POA: Insufficient documentation

## 2022-03-24 DIAGNOSIS — I10 Essential (primary) hypertension: Secondary | ICD-10-CM | POA: Insufficient documentation

## 2022-03-24 DIAGNOSIS — Z471 Aftercare following joint replacement surgery: Secondary | ICD-10-CM | POA: Diagnosis not present

## 2022-03-24 DIAGNOSIS — Z7982 Long term (current) use of aspirin: Secondary | ICD-10-CM | POA: Insufficient documentation

## 2022-03-24 DIAGNOSIS — Z96611 Presence of right artificial shoulder joint: Secondary | ICD-10-CM | POA: Diagnosis not present

## 2022-03-24 NOTE — Discharge Instructions (Signed)
The X-ray that he had today did not reveal any sort of dislocation or fracture.  Please schedule follow-up appointment with your orthopedic doctor for further evaluation.  You may need MRI to evaluate the soft tissues.

## 2022-03-24 NOTE — ED Provider Notes (Signed)
[facility] Provider Note   CSN: [id number] Arrival date & time: 03/24/22  1524     History PMH: GERD, PE, prior right shoulder surgery, HTN Chief Complaint  Patient presents with   Shoulder Injury    Dominique Cruz is a 81 y.o. female. Presents with right shoulder pain.  She said that she was actually just rolling her shoulders earlier when she felt a pop.  She says that she has a new bulge.  Her where her right shoulder is.  She concerned that she dislocated her shoulder as she was told that she was at risk for this after her shoulder surgery.  She denies any significant pain.  No numbness or tingling.   Shoulder Injury       Home Medications Prior to Admission medications   Medication Sig Start Date End Date Taking? Authorizing Provider  acetaminophen (TYLENOL) 650 MG CR tablet Take 650 mg by mouth every 8 (eight) hours as needed for pain.    [provider]  amoxicillin (AMOXIL) 500 MG capsule Take 1 capsule by mouth 3 times daily until gone 06/03/21     ARTIFICIAL TEAR OP Place 1 drop into both eyes 2 (two) times daily.    [provider]  aspirin 81 MG tablet Take 81 mg by mouth daily.    [provider]  B Complex-C (SUPER B COMPLEX PO) Take 1 tablet by mouth daily.     [provider]  BIOTIN PO Take 1 capsule by mouth daily.     [provider]  Calcium Carbonate-Vitamin D 600-400 MG-UNIT tablet Take 1 tablet by mouth daily.    [provider]  Cholecalciferol (VITAMIN D3) 10 MCG (400 UNIT) tablet Take 400 Units by mouth daily.    [provider]  Coenzyme Q10 (CO Q 10) 100 MG CAPS Take 100 mg by mouth daily.    [provider]  fish oil-omega-3 fatty acids 1000 MG capsule Take 1 g by mouth daily.    [provider]  fluticasone (FLONASE) 50 MCG/ACT nasal spray Place 2 sprays into both nostrils daily. 07/12/13   [provider]  Folic Acid (FOLATE  PO) Take 935 mcg by mouth daily.    [provider]  GLUCOSAMINE-CHONDROITIN PO Take 1,500 mg by mouth daily.     [provider]  ibuprofen (ADVIL) 800 MG tablet Take 800 mg by mouth as needed. 09/11/21   [provider]  L-ARGININE PO Take 1,500 mg by mouth 2 (two) times daily.    [provider]  LYSINE PO Take 1,000 mg by mouth daily.     [provider]  magnesium oxide (MAG-OX) 400 MG tablet Take 500 mg by mouth daily.    [provider]  MELATONIN PO Take 6 mg by mouth at bedtime.     [provider]  Misc Natural Products (TART CHERRY ADVANCED PO) Take 1,000 mg by mouth daily.     [provider]  Multiple Vitamin (MULTIVITAMIN) tablet Take 1 tablet by mouth daily.    [provider]  olmesartan (BENICAR) 20 MG tablet Take 20 mg by mouth daily.  09/28/18   [provider]  oxybutynin (DITROPAN) 5 MG tablet Take 5 mg by mouth daily.     [provider]  pantoprazole (PROTONIX) 20 MG tablet Take 20 mg by mouth daily. 09/09/16   [provider]  Quercetin 250 MG TABS Take 250 mg by mouth daily.  [provider]  vitamin B-12 (CYANOCOBALAMIN) 1000 MCG tablet Take 1,000 mcg by mouth daily.    [provider]  vitamin E 400 UNIT capsule Take 400 Units by mouth daily.    [provider]      Allergies    Clear eyes contact lens relief [daily cleaner], Contact lens care products, Sulfa antibiotics, and Sulfa drugs cross reactors    Review of Systems   Review of Systems  Musculoskeletal:  Positive for joint swelling.  All other systems reviewed and are negative.   Physical Exam Updated Vital Signs BP (!) 176/78 (BP Location: Left Arm)   Pulse 66   Temp 98.6 F (37 C) (Oral)   Resp 16   Ht 5\' 2"  (1.575 m)   Wt 68 kg   SpO2 98%   BMI 27.44 kg/m  Physical Exam Vitals and nursing note reviewed.  Constitutional:      General: She is not in acute  distress.    Appearance: Normal appearance. She is well-developed. She is not ill-appearing, toxic-appearing or diaphoretic.  HENT:     Head: Normocephalic and atraumatic.     Nose: No nasal deformity.     Mouth/Throat:     Lips: Pink. No lesions.  Eyes:     General: Gaze aligned appropriately. No scleral icterus.       Right eye: No discharge.        Left eye: No discharge.     Conjunctiva/sclera: Conjunctivae normal.     Right eye: Right conjunctiva is not injected. No exudate or hemorrhage.    Left eye: Left conjunctiva is not injected. No exudate or hemorrhage. Pulmonary:     Effort: Pulmonary effort is normal. No respiratory distress.  Musculoskeletal:     Comments: Right shoulder without any tenderness.  There is a slight swelling anterior to the right shoulder.  She has 2+ radial pulses.  Sensation intact.  Skin:    General: Skin is warm and dry.  Neurological:     Mental Status: She is alert and oriented to person, place, and time.  Psychiatric:        Mood and Affect: Mood normal.        Speech: Speech normal.        Behavior: Behavior normal. Behavior is cooperative.     ED Results / Procedures / Treatments   Labs (all labs ordered are listed, but only abnormal results are displayed) Labs Reviewed - No data to display  EKG None  Radiology DG Shoulder Right  Result Date: 03/24/2022 CLINICAL DATA:  Right shoulder deformity EXAM: RIGHT SHOULDER - 2+ VIEW COMPARISON:  None Available. 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 Arthroplasty components are in their expected alignment without dislocation. No periprosthetic lucency or fracture. AC joint intact with minimal arthropathy. No focal soft tissue swelling. 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 Electronically Signed   By: 03/26/2022 D.O.   On: 03/24/2022 16:02     Procedures Procedures   Medications Ordered in ED Medications - No data to display  ED Course/ Medical Decision Making/ A&P                           Medical Decision Making Amount and/or Complexity of Data Reviewed Radiology: ordered.    MDM  This is a 81 y.o. female who presents to the ED with right shoulder pain and  concern for dislocation.  She is neurovascularly intact.  X-ray does not reveal acute fracture or dislocation.  Hardware seems intact.  The swelling that she may be noticing could be soft tissue.  Recommend follow-up with her orthopedic surgeon and possible MRI.  This is a supervised visit with my attending physician, Dr. Rush Landmark. We have discussed this patient and they have altered the plan as needed.  Portions of this note were generated with Scientist, clinical (histocompatibility and immunogenetics). Dictation errors may occur despite best attempts at proofreading.    Final Clinical Impression(s) / ED Diagnoses Final diagnoses:  Acute pain of right shoulder    Rx / DC Orders ED Discharge Orders     None         Claudie Leach, PA-C 03/24/22 1739    Tegeler, Canary Brim, MD 03/24/22 1816

## 2022-03-24 NOTE — ED Triage Notes (Signed)
Pt concerned for right shoulder dislocation x 1 hour. Denies injury. + deformity noted.

## 2022-03-26 DIAGNOSIS — M6289 Other specified disorders of muscle: Secondary | ICD-10-CM | POA: Diagnosis not present

## 2022-03-26 DIAGNOSIS — M62838 Other muscle spasm: Secondary | ICD-10-CM | POA: Diagnosis not present

## 2022-03-26 DIAGNOSIS — M6281 Muscle weakness (generalized): Secondary | ICD-10-CM | POA: Diagnosis not present

## 2022-03-26 DIAGNOSIS — N3946 Mixed incontinence: Secondary | ICD-10-CM | POA: Diagnosis not present

## 2022-03-26 DIAGNOSIS — R152 Fecal urgency: Secondary | ICD-10-CM | POA: Diagnosis not present

## 2022-04-15 DIAGNOSIS — M6289 Other specified disorders of muscle: Secondary | ICD-10-CM | POA: Diagnosis not present

## 2022-04-15 DIAGNOSIS — M6281 Muscle weakness (generalized): Secondary | ICD-10-CM | POA: Diagnosis not present

## 2022-04-15 DIAGNOSIS — M62838 Other muscle spasm: Secondary | ICD-10-CM | POA: Diagnosis not present

## 2022-04-15 DIAGNOSIS — N3946 Mixed incontinence: Secondary | ICD-10-CM | POA: Diagnosis not present

## 2022-04-15 DIAGNOSIS — R152 Fecal urgency: Secondary | ICD-10-CM | POA: Diagnosis not present

## 2022-04-29 DIAGNOSIS — M62838 Other muscle spasm: Secondary | ICD-10-CM | POA: Diagnosis not present

## 2022-04-29 DIAGNOSIS — N3946 Mixed incontinence: Secondary | ICD-10-CM | POA: Diagnosis not present

## 2022-04-29 DIAGNOSIS — M6289 Other specified disorders of muscle: Secondary | ICD-10-CM | POA: Diagnosis not present

## 2022-04-29 DIAGNOSIS — M6281 Muscle weakness (generalized): Secondary | ICD-10-CM | POA: Diagnosis not present

## 2022-05-22 DIAGNOSIS — R152 Fecal urgency: Secondary | ICD-10-CM | POA: Diagnosis not present

## 2022-05-22 DIAGNOSIS — M62838 Other muscle spasm: Secondary | ICD-10-CM | POA: Diagnosis not present

## 2022-05-22 DIAGNOSIS — M6289 Other specified disorders of muscle: Secondary | ICD-10-CM | POA: Diagnosis not present

## 2022-05-22 DIAGNOSIS — N3946 Mixed incontinence: Secondary | ICD-10-CM | POA: Diagnosis not present

## 2022-05-22 DIAGNOSIS — M6281 Muscle weakness (generalized): Secondary | ICD-10-CM | POA: Diagnosis not present

## 2022-06-25 NOTE — Progress Notes (Deleted)
HPI: Follow-up mitral regurgitation. 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  Echocardiogram May 2023 showed normal LV function, grade 1 diastolic dysfunction, moderate left atrial enlargement, moderate to severe mitral regurgitation due to prolapse of posterior mitral valve leaflet, mild aortic insufficiency.  Since last seen   Current Outpatient Medications  Medication Sig Dispense Refill   acetaminophen (TYLENOL) 650 MG CR tablet Take 650 mg by mouth every 8 (eight) hours as needed for pain.     amoxicillin (AMOXIL) 500 MG capsule Take 1 capsule by mouth 3 times daily until gone 21 capsule 0   ARTIFICIAL TEAR OP Place 1 drop into both eyes 2 (two) times daily.     aspirin 81 MG tablet Take 81 mg by mouth daily.     B Complex-C (SUPER B COMPLEX PO) Take 1 tablet by mouth daily.      BIOTIN PO Take 1 capsule by mouth daily.      Calcium Carbonate-Vitamin D 600-400 MG-UNIT tablet Take 1 tablet by mouth daily.     Cholecalciferol (VITAMIN D3) 10 MCG (400 UNIT) tablet Take 400 Units by mouth daily.     Coenzyme Q10 (CO Q 10) 100 MG CAPS Take 100 mg by mouth daily.     fish oil-omega-3 fatty acids 1000 MG capsule Take 1 g by mouth daily.     fluticasone (FLONASE) 50 MCG/ACT nasal spray Place 2 sprays into both nostrils daily.     Folic Acid (FOLATE PO) Take 935 mcg by mouth daily.     GLUCOSAMINE-CHONDROITIN PO Take 1,500 mg by mouth daily.      ibuprofen (ADVIL) 800 MG tablet Take 800 mg by mouth as needed.     L-ARGININE PO Take 1,500 mg by mouth 2 (two) times daily.     LYSINE PO Take 1,000 mg by mouth daily.      magnesium oxide (MAG-OX) 400 MG tablet Take 500 mg by mouth daily.     MELATONIN PO Take 6 mg by mouth at bedtime.      Misc Natural Products (TART CHERRY ADVANCED  PO) Take 1,000 mg by mouth daily.      Multiple Vitamin (MULTIVITAMIN) tablet Take 1 tablet by mouth daily.     olmesartan (BENICAR) 20 MG tablet Take 20 mg by mouth daily.      oxybutynin (DITROPAN) 5 MG tablet Take 5 mg by mouth daily.      pantoprazole (PROTONIX) 20 MG tablet Take 20 mg by mouth daily.     Quercetin 250 MG TABS Take 250 mg by mouth daily.     vitamin B-12 (CYANOCOBALAMIN) 1000 MCG tablet Take 1,000 mcg by mouth daily.     vitamin E 400 UNIT capsule Take 400 Units by mouth daily.     No current facility-administered medications for this visit.     Past Medical History:  Diagnosis Date   Anemia    GERD (gastroesophageal reflux disease)    History of chicken pox    Hypertension    Mumps    Overactive bladder    PE (pulmonary embolism)    2012   Poison ivy    both arms healing well on prednisone    Scoliosis    Swelling    FEET AND LEGS B/L   Weakness of left leg     Past Surgical History:  Procedure Laterality Date   COLONOSCOPY WITH PROPOFOL N/A 11/24/2016   Procedure: COLONOSCOPY WITH PROPOFOL;  Surgeon: Charolett Bumpers, MD;  Location: [facility] ENDOSCOPY;  Service: Endoscopy;  Laterality: N/A;   FOOT SURGERY Bilateral    BUNION   FOOT SURGERY Right    TENDON right ankle   KNEE SURGERY Right    arthrscopy and meniscus repair   TEE WITHOUT CARDIOVERSION N/A 09/30/2019   Procedure: TRANSESOPHAGEAL ECHOCARDIOGRAM (TEE);  Surgeon: Lewayne Bunting, MD;  Location: [facility] ENDOSCOPY;  Service: Cardiovascular;  Laterality: N/A;    Social History   Socioeconomic History   Marital status: Married    Spouse name: Not on file   Number of children: 2   Years of education: Not on file   Highest education level: Not on file  Occupational History   Occupation: Retired  Tobacco Use   Smoking status: Former    Types: Cigarettes    Quit date: 05/11/1961    Years since quitting: 61.1   Smokeless tobacco: Never   Tobacco comments:    smoked few years  Substance and  Sexual Activity   Alcohol use: Yes    Alcohol/week: 0.0 standard drinks of alcohol    Comment: weekly   Drug use: No   Sexual activity: Not on file  Other Topics Concern   Not on file  Social History Narrative   Not on file   Social Determinants of Health   Financial Resource Strain: Not on file  Food Insecurity: Not on file  Transportation Needs: Not on file  Physical Activity: Not on file  Stress: Not on file  Social Connections: Not on file  Intimate Partner Violence: Not on file    Family History  Problem Relation Age of Onset   Arthritis Father    Heart disease Father    Allergies Father    Colon cancer Mother    Heart disease Mother    Mental illness Mother    Allergies Mother    Multiple sclerosis Brother    Prostate cancer Brother    Stroke Brother    Healthy Son        x1   Healthy Daughter        x1    ROS: no fevers or chills, productive cough, hemoptysis, dysphasia, odynophagia, melena, hematochezia, dysuria, hematuria, rash, seizure activity, orthopnea, PND, pedal edema, claudication. Remaining systems are negative.  Physical Exam: Well-developed well-nourished in no acute distress.  Skin is warm and dry.  HEENT is normal.  Neck is supple.  Chest is clear to auscultation with normal expansion.  Cardiovascular exam is regular rate and rhythm.  Abdominal exam nontender or distended. No masses palpated. Extremities show no edema. neuro grossly intact  ECG- personally reviewed  A/P  1 P2 prolapse with severe mitral regurgitation-we again discussed the options for her mitral regurgitation.  This would include proceeding with surgical intervention now versus following echocardiograms and proceeding with left ventricular enlargement, LV dysfunction or development of symptoms.  Given her age she would like to be conservative.  We will therefore plan follow-up echocardiogram May 2024.  2 hypertension-patient's blood pressure is controlled.  Continue  present medications.  Olga Millers, MD

## 2022-07-09 ENCOUNTER — Ambulatory Visit: Payer: Medicare PPO | Admitting: Cardiology

## 2022-07-09 DIAGNOSIS — I700 Atherosclerosis of aorta: Secondary | ICD-10-CM | POA: Diagnosis not present

## 2022-07-09 DIAGNOSIS — M1990 Unspecified osteoarthritis, unspecified site: Secondary | ICD-10-CM | POA: Diagnosis not present

## 2022-07-09 DIAGNOSIS — E7800 Pure hypercholesterolemia, unspecified: Secondary | ICD-10-CM | POA: Diagnosis not present

## 2022-07-09 DIAGNOSIS — Z1331 Encounter for screening for depression: Secondary | ICD-10-CM | POA: Diagnosis not present

## 2022-07-09 DIAGNOSIS — M48061 Spinal stenosis, lumbar region without neurogenic claudication: Secondary | ICD-10-CM | POA: Diagnosis not present

## 2022-07-09 DIAGNOSIS — Z86711 Personal history of pulmonary embolism: Secondary | ICD-10-CM | POA: Diagnosis not present

## 2022-07-09 DIAGNOSIS — Z0000 Encounter for general adult medical examination without abnormal findings: Secondary | ICD-10-CM | POA: Diagnosis not present

## 2022-07-09 DIAGNOSIS — M8588 Other specified disorders of bone density and structure, other site: Secondary | ICD-10-CM | POA: Diagnosis not present

## 2022-07-09 DIAGNOSIS — R7309 Other abnormal glucose: Secondary | ICD-10-CM | POA: Diagnosis not present

## 2022-07-09 DIAGNOSIS — I10 Essential (primary) hypertension: Secondary | ICD-10-CM | POA: Diagnosis not present

## 2022-07-09 DIAGNOSIS — R32 Unspecified urinary incontinence: Secondary | ICD-10-CM | POA: Diagnosis not present

## 2022-08-08 ENCOUNTER — Telehealth: Payer: Self-pay

## 2022-08-08 NOTE — Patient Outreach (Signed)
  Care Coordination   08/08/2022 Name: Dominique Cruz MRN: [id number] DOB: 1940-11-04   Care Coordination Outreach Attempts:  An unsuccessful telephone outreach was attempted today to offer the patient information about available care coordination services as a benefit of their health plan.   Follow Up Plan:  Additional outreach attempts will be made to offer the patient care coordination information and services.   Encounter Outcome:  No Answer   Care Coordination Interventions:  No, not indicated    Peter Garter RN, BSN,CCM, CDE Care Management Coordinator [facility] [phone]

## 2022-08-14 DIAGNOSIS — H6123 Impacted cerumen, bilateral: Secondary | ICD-10-CM | POA: Diagnosis not present

## 2022-08-18 DIAGNOSIS — H524 Presbyopia: Secondary | ICD-10-CM | POA: Diagnosis not present

## 2022-08-18 DIAGNOSIS — H35033 Hypertensive retinopathy, bilateral: Secondary | ICD-10-CM | POA: Diagnosis not present

## 2022-08-18 DIAGNOSIS — H26493 Other secondary cataract, bilateral: Secondary | ICD-10-CM | POA: Diagnosis not present

## 2022-08-19 ENCOUNTER — Telehealth: Payer: Self-pay

## 2022-08-19 NOTE — Patient Outreach (Signed)
  Care Coordination   08/19/2022 Name: Dominique Cruz MRN: [id number] DOB: October 17, 1940   Care Coordination Outreach Attempts:  A second unsuccessful outreach was attempted today to offer the patient with information about available care coordination services as a benefit of their health plan.     Follow Up Plan:  Additional outreach attempts will be made to offer the patient care coordination information and services.   Encounter Outcome:  No Answer   Care Coordination Interventions:  No, not indicated    Peter Garter RN, BSN,CCM, CDE Care Management Coordinator [facility] [phone]

## 2022-08-27 ENCOUNTER — Telehealth: Payer: Self-pay

## 2022-08-27 NOTE — Patient Outreach (Signed)
  Care Coordination   08/27/2022 Name: Dominique Cruz MRN: [id number] DOB: 1941/04/28   Care Coordination Outreach Attempts:  A third unsuccessful outreach was attempted today to offer the patient with information about available care coordination services as a benefit of their health plan.   Follow Up Plan:  No further outreach attempts will be made at this time. We have been unable to contact the patient to offer or enroll patient in care coordination services  Encounter Outcome:  No Answer   Care Coordination Interventions:  No, not indicated    Peter Garter RN, BSN,CCM, [facility] [phone]

## 2022-11-27 NOTE — Progress Notes (Signed)
HPI: Follow-up mitral regurgitation. 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  Echocardiogram May 2023 showed normal LV function, grade 1 diastolic dysfunction, moderate left atrial enlargement, moderate to severe mitral regurgitation due to prolapse of posterior mitral valve leaflet, mild aortic insufficiency.  Since last seen she denies dyspnea on exertion, orthopnea, PND, chest pain or syncope.  She has chronic minimal pedal edema.  Current Outpatient Medications  Medication Sig Dispense Refill   acetaminophen (TYLENOL) 650 MG CR tablet Take 650 mg by mouth every 8 (eight) hours as needed for pain.     ARTIFICIAL TEAR OP Place 1 drop into both eyes 2 (two) times daily.     aspirin 81 MG tablet Take 81 mg by mouth daily.     B Complex-C (SUPER B COMPLEX PO) Take 1 tablet by mouth daily.      BIOTIN PO Take 1 capsule by mouth daily.      Calcium Carbonate-Vitamin D 600-400 MG-UNIT tablet Take 1 tablet by mouth daily.     Cholecalciferol (VITAMIN D3) 10 MCG (400 UNIT) tablet Take 400 Units by mouth daily.     Coenzyme Q10 (CO Q 10) 100 MG CAPS Take 100 mg by mouth daily.     fish oil-omega-3 fatty acids 1000 MG capsule Take 1 g by mouth daily.     fluticasone (FLONASE) 50 MCG/ACT nasal spray Place 2 sprays into both nostrils daily.     Folic Acid (FOLATE PO) Take 935 mcg by mouth daily.     GLUCOSAMINE-CHONDROITIN PO Take 1,500 mg by mouth daily.      ibuprofen (ADVIL) 800 MG tablet Take 800 mg by mouth as needed.     L-ARGININE PO Take 1,500 mg by mouth 2 (two) times daily.     LYSINE PO Take 1,000 mg by mouth daily.      magnesium oxide (MAG-OX) 400 MG tablet Take 500 mg by mouth daily.     MELATONIN PO Take 6 mg by mouth at bedtime.      Misc Natural Products (TART CHERRY  ADVANCED PO) Take 1,000 mg by mouth daily.      olmesartan (BENICAR) 20 MG tablet Take 20 mg by mouth daily.      oxybutynin (DITROPAN) 5 MG tablet Take 5 mg by mouth daily.      pantoprazole (PROTONIX) 20 MG tablet Take 20 mg by mouth daily.     predniSONE (DELTASONE) 5 MG tablet Take 6 tablets by mouth on day 1, then 5 tablets on day 2, then 4 tablets on day 3, then 3 tablets on day 4, then 2 tablets on day 5, and then 1 tablet on day 6. 21 tablet 0   Quercetin 250 MG TABS Take 250 mg by mouth daily.     vitamin B-12 (CYANOCOBALAMIN) 1000 MCG tablet Take 1,000 mcg by mouth daily.     vitamin E 400 UNIT capsule Take 400 Units by mouth daily.     amoxicillin (AMOXIL) 500 MG capsule Take 1 capsule by mouth 3 times daily until gone (Patient not taking: Reported on 12/03/2022) 21 capsule 0   Multiple Vitamin (MULTIVITAMIN) tablet Take 1 tablet by mouth daily. (Patient not taking: Reported on 12/03/2022)     No current facility-administered medications for this visit.     Past Medical History:  Diagnosis Date   Anemia  GERD (gastroesophageal reflux disease)    History of chicken pox    Hypertension    Mumps    Overactive bladder    PE (pulmonary embolism)    2012   Poison ivy    both arms healing well on prednisone    Scoliosis    Swelling    FEET AND LEGS B/L   Weakness of left leg     Past Surgical History:  Procedure Laterality Date   COLONOSCOPY WITH PROPOFOL N/A 11/24/2016   Procedure: COLONOSCOPY WITH PROPOFOL;  Surgeon: Charolett Bumpers, MD;  Location: [facility] ENDOSCOPY;  Service: Endoscopy;  Laterality: N/A;   FOOT SURGERY Bilateral    BUNION   FOOT SURGERY Right    TENDON right ankle   KNEE SURGERY Right    arthrscopy and meniscus repair   TEE WITHOUT CARDIOVERSION N/A 09/30/2019   Procedure: TRANSESOPHAGEAL ECHOCARDIOGRAM (TEE);  Surgeon: Lewayne Bunting, MD;  Location: [facility] ENDOSCOPY;  Service: Cardiovascular;  Laterality: N/A;    Social History   Socioeconomic History    Marital status: Married    Spouse name: Not on file   Number of children: 2   Years of education: Not on file   Highest education level: Not on file  Occupational History   Occupation: Retired  Tobacco Use   Smoking status: Former    Types: Cigarettes    Quit date: 05/11/1961    Years since quitting: 61.6   Smokeless tobacco: Never   Tobacco comments:    smoked few years  Substance and Sexual Activity   Alcohol use: Yes    Alcohol/week: 0.0 standard drinks of alcohol    Comment: weekly   Drug use: No   Sexual activity: Not on file  Other Topics Concern   Not on file  Social History Narrative   Not on file   Social Determinants of Health   Financial Resource Strain: Not on file  Food Insecurity: Not on file  Transportation Needs: Not on file  Physical Activity: Not on file  Stress: Not on file  Social Connections: Not on file  Intimate Partner Violence: Not on file    Family History  Problem Relation Age of Onset   Arthritis Father    Heart disease Father    Allergies Father    Colon cancer Mother    Heart disease Mother    Mental illness Mother    Allergies Mother    Multiple sclerosis Brother    Prostate cancer Brother    Stroke Brother    Healthy Son        x1   Healthy Daughter        x1    ROS: no fevers or chills, productive cough, hemoptysis, dysphasia, odynophagia, melena, hematochezia, dysuria, hematuria, rash, seizure activity, orthopnea, PND, claudication. Remaining systems are negative.  Physical Exam: Well-developed well-nourished in no acute distress.  Skin is warm and dry.  HEENT is normal.  Neck is supple.  Chest is clear to auscultation with normal expansion.  Cardiovascular exam is regular rate and rhythm.  3/6 systolic murmur apex. Abdominal exam nontender or distended. No masses palpated. Extremities show trace edema. neuro grossly intact  ECG-sinus rhythm at a rate of 61, normal axis, no ST changes.  Personally  reviewed  A/P  1 mitral regurgitation-patient has severe mitral regurgitation related to P2 prolapse.  We have previously discussed the options for her mitral regurgitation.  One would be to follow serial echocardiograms and proceed with repair if there  is left ventricular enlargement, LV dysfunction or occurrence of atrial fibrillation; also if she develops symptoms.  Alternatively could refer for early repair.  Given her age she has been somewhat hesitant to proceed with surgery and would prefer to be conservative (but would be agreeable if she develops symptoms or echo indications for valve repair).  We will therefore plan to repeat her echocardiogram.  If LV function is normal and there is no left ventricular enlargement we will continue to follow.  Note she remains asymptomatic.  2 hypertension-patient's blood pressure is mildly elevated.  However it is controlled at home.  Will follow and adjust as needed.  Olga Millers, MD

## 2022-12-01 ENCOUNTER — Other Ambulatory Visit (HOSPITAL_BASED_OUTPATIENT_CLINIC_OR_DEPARTMENT_OTHER): Payer: Self-pay

## 2022-12-01 MED ORDER — PREDNISONE 5 MG PO TABS
ORAL_TABLET | ORAL | 0 refills | Status: DC
  Filled 2022-12-01: qty 21, 6d supply, fill #0

## 2022-12-03 ENCOUNTER — Ambulatory Visit: Payer: Medicare PPO | Attending: Cardiology | Admitting: Cardiology

## 2022-12-03 ENCOUNTER — Encounter: Payer: Self-pay | Admitting: Cardiology

## 2022-12-03 VITALS — BP 146/70 | HR 61 | Ht 62.0 in | Wt 158.0 lb

## 2022-12-03 DIAGNOSIS — I340 Nonrheumatic mitral (valve) insufficiency: Secondary | ICD-10-CM

## 2022-12-03 DIAGNOSIS — I10 Essential (primary) hypertension: Secondary | ICD-10-CM

## 2022-12-03 NOTE — Patient Instructions (Signed)
  Testing/Procedures:  Your physician has requested that you have an echocardiogram. Echocardiography is a painless test that uses sound waves to create images of your heart. It provides your doctor with information about the size and shape of your heart and how well your heart's chambers and valves are working. This procedure takes approximately one hour. There are no restrictions for this procedure. Please do NOT wear cologne, perfume, aftershave, or lotions (deodorant is allowed). Please arrive 15 minutes prior to your appointment time. [place] [facility]-1ST FLOOR IMAGING DEPARTMENT   Follow-Up: At [facility], you and your health needs are our priority.  As part of our continuing mission to provide you with exceptional heart care, we have created designated Provider Care Teams.  These Care Teams include your primary Cardiologist (physician) and Advanced Practice Providers (APPs -  Physician Assistants and Nurse Practitioners) who all work together to provide you with the care you need, when you need it.  We recommend signing up for the patient portal called "MyChart".  Sign up information is provided on this After Visit Summary.  MyChart is used to connect with patients for Virtual Visits (Telemedicine).  Patients are able to view lab/test results, encounter notes, upcoming appointments, etc.  Non-urgent messages can be sent to your provider as well.   To learn more about what you can do with MyChart, go to [email or web address].    Your next appointment:   6 month(s)  Provider:   Olga Millers, MD

## 2022-12-24 ENCOUNTER — Ambulatory Visit (HOSPITAL_BASED_OUTPATIENT_CLINIC_OR_DEPARTMENT_OTHER)
Admission: RE | Admit: 2022-12-24 | Discharge: 2022-12-24 | Disposition: A | Discharge status: Home / Self Care | Payer: Medicare PPO | Source: Ambulatory Visit | Attending: Cardiology | Admitting: Cardiology

## 2022-12-24 DIAGNOSIS — R600 Localized edema: Secondary | ICD-10-CM | POA: Diagnosis not present

## 2022-12-24 DIAGNOSIS — I340 Nonrheumatic mitral (valve) insufficiency: Secondary | ICD-10-CM | POA: Insufficient documentation

## 2022-12-24 LAB — ECHOCARDIOGRAM COMPLETE
AR max vel: 2.09 cm2
AV Area VTI: 2.06 cm2
AV Area mean vel: 1.97 cm2
AV Mean grad: 6 mmHg
AV Peak grad: 11.6 mmHg
Ao pk vel: 1.7 m/s
Area-P 1/2: 3.15 cm2
S' Lateral: 3 cm

## 2022-12-30 ENCOUNTER — Encounter: Payer: Self-pay | Admitting: *Deleted

## 2023-02-03 DIAGNOSIS — I700 Atherosclerosis of aorta: Secondary | ICD-10-CM | POA: Diagnosis not present

## 2023-02-03 DIAGNOSIS — I10 Essential (primary) hypertension: Secondary | ICD-10-CM | POA: Diagnosis not present

## 2023-02-03 DIAGNOSIS — I340 Nonrheumatic mitral (valve) insufficiency: Secondary | ICD-10-CM | POA: Diagnosis not present

## 2023-02-03 DIAGNOSIS — M810 Age-related osteoporosis without current pathological fracture: Secondary | ICD-10-CM | POA: Diagnosis not present

## 2023-02-03 DIAGNOSIS — R32 Unspecified urinary incontinence: Secondary | ICD-10-CM | POA: Diagnosis not present

## 2023-02-03 DIAGNOSIS — K219 Gastro-esophageal reflux disease without esophagitis: Secondary | ICD-10-CM | POA: Diagnosis not present

## 2023-05-07 DIAGNOSIS — Z6830 Body mass index (BMI) 30.0-30.9, adult: Secondary | ICD-10-CM | POA: Diagnosis not present

## 2023-05-07 DIAGNOSIS — Z1231 Encounter for screening mammogram for malignant neoplasm of breast: Secondary | ICD-10-CM | POA: Diagnosis not present

## 2023-05-07 DIAGNOSIS — Z124 Encounter for screening for malignant neoplasm of cervix: Secondary | ICD-10-CM | POA: Diagnosis not present

## 2023-06-11 NOTE — Progress Notes (Signed)
HPI: Follow-up mitral regurgitation. 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  Echocardiogram May 2024 showed normal LV function, grade 2 diastolic dysfunction, moderate left atrial enlargement, prolapse of P2 with at least moderate mitral regurgitation, mild aortic insufficiency.  Left ventricular end-systolic dimension was noted to be 3 cm.  Since last seen there is no dyspnea on exertion, orthopnea, PND, chest pain, palpitations or syncope.  Chronic minimal pedal edema.  Current Outpatient Medications  Medication Sig Dispense Refill   acetaminophen (TYLENOL) 650 MG CR tablet Take 650 mg by mouth every 8 (eight) hours as needed for pain.     amoxicillin (AMOXIL) 500 MG capsule Take 1 capsule by mouth 3 times daily until gone (Patient not taking: Reported on 12/03/2022) 21 capsule 0   ARTIFICIAL TEAR OP Place 1 drop into both eyes 2 (two) times daily.     aspirin 81 MG tablet Take 81 mg by mouth daily.     B Complex-C (SUPER B COMPLEX PO) Take 1 tablet by mouth daily.      BIOTIN PO Take 1 capsule by mouth daily.      Calcium Carbonate-Vitamin D 600-400 MG-UNIT tablet Take 1 tablet by mouth daily.     Cholecalciferol (VITAMIN D3) 10 MCG (400 UNIT) tablet Take 400 Units by mouth daily.     Coenzyme Q10 (CO Q 10) 100 MG CAPS Take 100 mg by mouth daily.     fish oil-omega-3 fatty acids 1000 MG capsule Take 1 g by mouth daily.     fluticasone (FLONASE) 50 MCG/ACT nasal spray Place 2 sprays into both nostrils daily.     Folic Acid (FOLATE PO) Take 935 mcg by mouth daily.     GLUCOSAMINE-CHONDROITIN PO Take 1,500 mg by mouth daily.      ibuprofen (ADVIL) 800 MG tablet Take 800 mg by mouth as needed.     L-ARGININE PO Take 1,500 mg by mouth 2 (two) times daily.     LYSINE PO Take 1,000  mg by mouth daily.      magnesium oxide (MAG-OX) 400 MG tablet Take 500 mg by mouth daily.     MELATONIN PO Take 6 mg by mouth at bedtime.      Misc Natural Products (TART CHERRY ADVANCED PO) Take 1,000 mg by mouth daily.      Multiple Vitamin (MULTIVITAMIN) tablet Take 1 tablet by mouth daily. (Patient not taking: Reported on 12/03/2022)     olmesartan (BENICAR) 20 MG tablet Take 20 mg by mouth daily.      oxybutynin (DITROPAN) 5 MG tablet Take 5 mg by mouth daily.      pantoprazole (PROTONIX) 20 MG tablet Take 20 mg by mouth daily.     predniSONE (DELTASONE) 5 MG tablet Take 6 tablets by mouth on day 1, then 5 tablets on day 2, then 4 tablets on day 3, then 3 tablets on day 4, then 2 tablets on day 5, and then 1 tablet on day 6. 21 tablet 0   Quercetin 250 MG TABS Take 250 mg by mouth daily.     vitamin B-12 (CYANOCOBALAMIN) 1000 MCG tablet Take 1,000 mcg by mouth daily.     vitamin E 400 UNIT capsule Take 400 Units by mouth daily.     No current facility-administered medications for this visit.     Past Medical  History:  Diagnosis Date   Anemia    GERD (gastroesophageal reflux disease)    History of chicken pox    Hypertension    Mumps    Overactive bladder    PE (pulmonary embolism)    2012   Poison ivy    both arms healing well on prednisone    Scoliosis    Swelling    FEET AND LEGS B/L   Weakness of left leg     Past Surgical History:  Procedure Laterality Date   COLONOSCOPY WITH PROPOFOL N/A 11/24/2016   Procedure: COLONOSCOPY WITH PROPOFOL;  Surgeon: Charolett Bumpers, MD;  Location: [facility] ENDOSCOPY;  Service: Endoscopy;  Laterality: N/A;   FOOT SURGERY Bilateral    BUNION   FOOT SURGERY Right    TENDON right ankle   KNEE SURGERY Right    arthrscopy and meniscus repair   TEE WITHOUT CARDIOVERSION N/A 09/30/2019   Procedure: TRANSESOPHAGEAL ECHOCARDIOGRAM (TEE);  Surgeon: Lewayne Bunting, MD;  Location[facility] ENDOSCOPY;  Service: Cardiovascular;  Laterality: N/A;     Social History   Socioeconomic History   Marital status: Married    Spouse name: Not on file   Number of children: 2   Years of education: Not on file   Highest education level: Not on file  Occupational History   Occupation: Retired  Tobacco Use   Smoking status: Former    Current packs/day: 0.00    Types: Cigarettes    Quit date: 05/11/1961    Years since quitting: 62.1   Smokeless tobacco: Never   Tobacco comments:    smoked few years  Substance and Sexual Activity   Alcohol use: Yes    Alcohol/week: 0.0 standard drinks of alcohol    Comment: weekly   Drug use: No   Sexual activity: Not on file  Other Topics Concern   Not on file  Social History Narrative   Not on file   Social Determinants of Health   Financial Resource Strain: Not on file  Food Insecurity: Not on file  Transportation Needs: Not on file  Physical Activity: Not on file  Stress: Not on file  Social Connections: Not on file  Intimate Partner Violence: Not on file    Family History  Problem Relation Age of Onset   Arthritis Father    Heart disease Father    Allergies Father    Colon cancer Mother    Heart disease Mother    Mental illness Mother    Allergies Mother    Multiple sclerosis Brother    Prostate cancer Brother    Stroke Brother    Healthy Son        x1   Healthy Daughter        x1    ROS: no fevers or chills, productive cough, hemoptysis, dysphasia, odynophagia, melena, hematochezia, dysuria, hematuria, rash, seizure activity, orthopnea, PND, pedal edema, claudication. Remaining systems are negative.  Physical Exam: Well-developed well-nourished in no acute distress.  Skin is warm and dry.  HEENT is normal.  Neck is supple.  Chest is clear to auscultation with normal expansion.  Cardiovascular exam is regular rate and rhythm. 2/6 systolic murmur apex Abdominal exam nontender or distended. No masses palpated. Extremities show trace edema. neuro grossly  intact   A/P  1 mitral regurgitation-patient has severe mitral regurgitation secondary to prolapse of P2.  We have previously discussed options for her mitral regurgitation.  This includes proceeding with repair if follow-up echocardiograms demonstrate left ventricular  enlargement, LV dysfunction or if she develops symptoms or atrial fibrillation.  Alternatively could refer for early repair.  Given her age and lack of symptoms she has preferred to be conservative.  She would be agreeable if she develops symptoms or other echo indications for mitral valve repair.  Plan repeat echocardiogram May 2025.  2 hypertension-blood pressure elevated but typically controlled.  Continue present medications and follow.  Olga Millers, MD

## 2023-06-24 ENCOUNTER — Ambulatory Visit: Payer: Medicare PPO | Attending: Cardiology | Admitting: Cardiology

## 2023-06-24 ENCOUNTER — Encounter: Payer: Self-pay | Admitting: Cardiology

## 2023-06-24 VITALS — BP 150/73 | HR 100 | Ht 62.0 in | Wt 161.8 lb

## 2023-06-24 DIAGNOSIS — I10 Essential (primary) hypertension: Secondary | ICD-10-CM | POA: Diagnosis not present

## 2023-06-24 DIAGNOSIS — I340 Nonrheumatic mitral (valve) insufficiency: Secondary | ICD-10-CM | POA: Diagnosis not present

## 2023-06-24 NOTE — Patient Instructions (Signed)
    Follow-Up: At [facility], you and your health needs are our priority.  As part of our continuing mission to provide you with exceptional heart care, we have created designated Provider Care Teams.  These Care Teams include your primary Cardiologist (physician) and Advanced Practice Providers (APPs -  Physician Assistants and Nurse Practitioners) who all work together to provide you with the care you need, when you need it.    Your next appointment:   6 month(s)  Provider:   Olga Millers, MD

## 2023-07-13 DIAGNOSIS — Z23 Encounter for immunization: Secondary | ICD-10-CM | POA: Diagnosis not present

## 2023-07-13 DIAGNOSIS — R32 Unspecified urinary incontinence: Secondary | ICD-10-CM | POA: Diagnosis not present

## 2023-07-13 DIAGNOSIS — I700 Atherosclerosis of aorta: Secondary | ICD-10-CM | POA: Diagnosis not present

## 2023-07-13 DIAGNOSIS — Z0000 Encounter for general adult medical examination without abnormal findings: Secondary | ICD-10-CM | POA: Diagnosis not present

## 2023-07-13 DIAGNOSIS — K219 Gastro-esophageal reflux disease without esophagitis: Secondary | ICD-10-CM | POA: Diagnosis not present

## 2023-07-13 DIAGNOSIS — I10 Essential (primary) hypertension: Secondary | ICD-10-CM | POA: Diagnosis not present

## 2023-07-13 DIAGNOSIS — M810 Age-related osteoporosis without current pathological fracture: Secondary | ICD-10-CM | POA: Diagnosis not present

## 2023-07-13 DIAGNOSIS — Z9181 History of falling: Secondary | ICD-10-CM | POA: Diagnosis not present

## 2023-07-13 DIAGNOSIS — R7309 Other abnormal glucose: Secondary | ICD-10-CM | POA: Diagnosis not present

## 2023-07-13 DIAGNOSIS — E7800 Pure hypercholesterolemia, unspecified: Secondary | ICD-10-CM | POA: Diagnosis not present

## 2023-07-13 DIAGNOSIS — I340 Nonrheumatic mitral (valve) insufficiency: Secondary | ICD-10-CM | POA: Diagnosis not present

## 2023-08-31 DIAGNOSIS — Z135 Encounter for screening for eye and ear disorders: Secondary | ICD-10-CM | POA: Diagnosis not present

## 2023-08-31 DIAGNOSIS — H524 Presbyopia: Secondary | ICD-10-CM | POA: Diagnosis not present

## 2023-08-31 DIAGNOSIS — H26493 Other secondary cataract, bilateral: Secondary | ICD-10-CM | POA: Diagnosis not present

## 2023-11-06 ENCOUNTER — Other Ambulatory Visit: Payer: Self-pay | Admitting: *Deleted

## 2023-11-06 DIAGNOSIS — I340 Nonrheumatic mitral (valve) insufficiency: Secondary | ICD-10-CM

## 2023-12-10 ENCOUNTER — Ambulatory Visit (HOSPITAL_BASED_OUTPATIENT_CLINIC_OR_DEPARTMENT_OTHER)
Admission: RE | Admit: 2023-12-10 | Discharge: 2023-12-10 | Disposition: A | Discharge status: Home / Self Care | Source: Ambulatory Visit | Attending: Cardiology | Admitting: Cardiology

## 2023-12-10 DIAGNOSIS — I340 Nonrheumatic mitral (valve) insufficiency: Secondary | ICD-10-CM | POA: Diagnosis not present

## 2023-12-10 LAB — ECHOCARDIOGRAM COMPLETE
AR max vel: 1.82 cm2
AV Area VTI: 1.86 cm2
AV Area mean vel: 1.91 cm2
AV Mean grad: 6 mmHg
AV Peak grad: 11.3 mmHg
AV Vena cont: 0.2 cm
Ao pk vel: 1.68 m/s
Area-P 1/2: 4.96 cm2
Calc EF: 64.2 %
MV M vel: 3.38 m/s
MV Peak grad: 45.8 mmHg
MV Vena cont: 0.1 cm
P 1/2 time: 763 ms
Radius: 0.3 cm
S' Lateral: 2.8 cm
Single Plane A2C EF: 62.3 %
Single Plane A4C EF: 64.9 %

## 2023-12-16 ENCOUNTER — Other Ambulatory Visit: Payer: Self-pay | Admitting: *Deleted

## 2023-12-16 ENCOUNTER — Ambulatory Visit: Payer: Self-pay | Admitting: *Deleted

## 2023-12-16 DIAGNOSIS — I340 Nonrheumatic mitral (valve) insufficiency: Secondary | ICD-10-CM

## 2024-02-17 ENCOUNTER — Ambulatory Visit: Admitting: Podiatry

## 2024-04-01 NOTE — Progress Notes (Signed)
 HPI: Follow-up mitral regurgitation. 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  Echocardiogram May 2025 showed prolapse/flail segment of posterior mitral valve leaflet with moderate to severe mitral digitation, normal LV function, mild left atrial enlargement, mild aortic insufficiency.  Since last seen patient denies dyspnea on exertion, orthopnea, PND, pedal edema, chest pain or syncope.  No palpitations.  Current Outpatient Medications  Medication Sig Dispense Refill   acetaminophen (TYLENOL) 650 MG CR tablet Take 650 mg by mouth every 8 (eight) hours as needed for pain.     ARTIFICIAL TEAR OP Place 1 drop into both eyes 2 (two) times daily.     aspirin 81 MG tablet Take 81 mg by mouth daily.     B Complex-C (SUPER B COMPLEX PO) Take 1 tablet by mouth daily.      BIOTIN PO Take 1 capsule by mouth daily.      Calcium Carbonate-Vitamin D 600-400 MG-UNIT tablet Take 1 tablet by mouth daily.     Cholecalciferol (VITAMIN D3) 10 MCG (400 UNIT) tablet Take 400 Units by mouth daily.     Coenzyme Q10 (CO Q 10) 100 MG CAPS Take 100 mg by mouth daily.     fish oil-omega-3 fatty acids 1000 MG capsule Take 1 g by mouth daily.     fluticasone (FLONASE) 50 MCG/ACT nasal spray Place 2 sprays into both nostrils daily.     Folic Acid (FOLATE PO) Take 935 mcg by mouth daily.     GLUCOSAMINE-CHONDROITIN PO Take 1,500 mg by mouth daily.      ibuprofen (ADVIL) 800 MG tablet Take 800 mg by mouth as needed.     L-ARGININE PO Take 1,500 mg by mouth 2 (two) times daily.     LYSINE PO Take 1,000 mg by mouth daily.      magnesium oxide (MAG-OX) 400 MG tablet Take 500 mg by mouth daily.     MELATONIN PO Take 6 mg by mouth at bedtime.      Misc Natural Products (TART CHERRY ADVANCED PO) Take 1,000 mg by  mouth daily.      Multiple Vitamin (MULTIVITAMIN) tablet Take 1 tablet by mouth daily.     olmesartan (BENICAR) 20 MG tablet Take 20 mg by mouth daily.      oxybutynin (DITROPAN) 5 MG tablet Take 5 mg by mouth daily.      Quercetin 250 MG TABS Take 250 mg by mouth daily.     vitamin B-12 (CYANOCOBALAMIN) 1000 MCG tablet Take 1,000 mcg by mouth daily.     vitamin E 400 UNIT capsule Take 400 Units by mouth daily.     pantoprazole (PROTONIX) 20 MG tablet Take 20 mg by mouth daily. (Patient not taking: Reported on 04/13/2024)     No current facility-administered medications for this visit.     Past Medical History:  Diagnosis Date   Anemia    GERD (gastroesophageal reflux disease)    History of chicken pox    Hypertension    Mumps    Overactive bladder    PE (pulmonary embolism)    2012   Poison ivy    both arms healing well on prednisone     Scoliosis    Swelling    FEET AND LEGS B/L   Weakness of left leg     Past Surgical History:  Procedure Laterality  Date   COLONOSCOPY WITH PROPOFOL  N/A 11/24/2016   Procedure: COLONOSCOPY WITH PROPOFOL ;  Surgeon: Gladis MARLA Louder, MD;  Location: [facility];  Service: Endoscopy;  Laterality: N/A;   FOOT SURGERY Bilateral    BUNION   FOOT SURGERY Right    TENDON right ankle   KNEE SURGERY Right    arthrscopy and meniscus repair   TEE WITHOUT CARDIOVERSION N/A 09/30/2019   Procedure: TRANSESOPHAGEAL ECHOCARDIOGRAM (TEE);  Surgeon: Pietro Redell RAMAN, MD;  Location: [facility];  Service: Cardiovascular;  Laterality: N/A;    Social History   Socioeconomic History   Marital status: Married    Spouse name: Not on file   Number of children: 2   Years of education: Not on file   Highest education level: Not on file  Occupational History   Occupation: Retired  Tobacco Use   Smoking status: Former    Current packs/day: 0.00    Types: Cigarettes    Quit date: 05/11/1961    Years since quitting: 62.9   Smokeless tobacco: Never    Tobacco comments:    smoked few years  Substance and Sexual Activity   Alcohol use: Yes    Alcohol/week: 0.0 standard drinks of alcohol    Comment: weekly   Drug use: No   Sexual activity: Not on file  Other Topics Concern   Not on file  Social History Narrative   Not on file   Social Drivers of Health   Financial Resource Strain: Not on file  Food Insecurity: Not on file  Transportation Needs: Not on file  Physical Activity: Not on file  Stress: Not on file  Social Connections: Not on file  Intimate Partner Violence: Not on file    Family History  Problem Relation Age of Onset   Arthritis Father    Heart disease Father    Allergies Father    Colon cancer Mother    Heart disease Mother    Mental illness Mother    Allergies Mother    Multiple sclerosis Brother    Prostate cancer Brother    Stroke Brother    Healthy Son        x1   Healthy Daughter        x1    ROS: no fevers or chills, productive cough, hemoptysis, dysphasia, odynophagia, melena, hematochezia, dysuria, hematuria, rash, seizure activity, orthopnea, PND, pedal edema, claudication. Remaining systems are negative.  Physical Exam: Well-developed well-nourished in no acute distress.  Skin is warm and dry.  HEENT is normal.  Neck is supple.  Chest is clear to auscultation with normal expansion.  Cardiovascular exam is regular rate and rhythm.  2/6 systolic murmur apex. Abdominal exam nontender or distended. No masses palpated. Extremities show no edema. neuro grossly intact  EKG Interpretation Date/Time:  Wednesday April 13 2024 12:03:06 EDT Ventricular Rate:  69 PR Interval:  156 QRS Duration:  72 QT Interval:  386 QTC Calculation: 413 R Axis:   72  Text Interpretation: Normal sinus rhythm Normal ECG When compared with ECG of 29-Jul-2017 17:17, No significant change was found Confirmed by Pietro Redell ([id number]) on 04/13/2024 12:11:22 PM    A/P  1 mitral digitation-patient has severe  mitral regurgitation secondary to prolapse/flail segment of P2.  We previously discussed options for her mitral regurgitation including early repair versus follow-up echocardiograms and preceding if patient develops left ventricular enlargement, LV dysfunction, atrial fibrillation or symptoms.  She would prefer conservative measures which I think is reasonable given her age.  Will plan follow-up echocardiogram May 2026 or sooner if needed.  2 hypertension-patient's blood pressure is elevated; however she follows this at home and it is typically controlled.  Continue present medications and follow.  Redell Shallow, MD

## 2024-04-13 ENCOUNTER — Ambulatory Visit: Attending: Cardiology | Admitting: Cardiology

## 2024-04-13 ENCOUNTER — Encounter: Payer: Self-pay | Admitting: Cardiology

## 2024-04-13 VITALS — BP 155/81 | HR 69 | Ht 62.0 in | Wt 151.0 lb

## 2024-04-13 DIAGNOSIS — I10 Essential (primary) hypertension: Secondary | ICD-10-CM

## 2024-04-13 DIAGNOSIS — I340 Nonrheumatic mitral (valve) insufficiency: Secondary | ICD-10-CM

## 2024-04-13 NOTE — Patient Instructions (Signed)

## 2024-05-17 DIAGNOSIS — M810 Age-related osteoporosis without current pathological fracture: Secondary | ICD-10-CM | POA: Diagnosis not present

## 2024-05-17 DIAGNOSIS — I10 Essential (primary) hypertension: Secondary | ICD-10-CM | POA: Diagnosis not present

## 2024-05-17 DIAGNOSIS — I340 Nonrheumatic mitral (valve) insufficiency: Secondary | ICD-10-CM | POA: Diagnosis not present

## 2024-05-17 DIAGNOSIS — Z0000 Encounter for general adult medical examination without abnormal findings: Secondary | ICD-10-CM | POA: Diagnosis not present

## 2024-05-17 DIAGNOSIS — E7800 Pure hypercholesterolemia, unspecified: Secondary | ICD-10-CM | POA: Diagnosis not present

## 2024-05-17 DIAGNOSIS — I700 Atherosclerosis of aorta: Secondary | ICD-10-CM | POA: Diagnosis not present

## 2024-05-17 DIAGNOSIS — Z1389 Encounter for screening for other disorder: Secondary | ICD-10-CM | POA: Diagnosis not present

## 2024-05-17 DIAGNOSIS — Z86711 Personal history of pulmonary embolism: Secondary | ICD-10-CM | POA: Diagnosis not present

## 2024-05-17 DIAGNOSIS — G629 Polyneuropathy, unspecified: Secondary | ICD-10-CM | POA: Diagnosis not present
